# Patient Record
Sex: Female | Born: 1975 | Hispanic: Yes | Marital: Married | Smoking: Never smoker
Health system: Southern US, Community
[De-identification: ages and names within clinical notes are randomized; demographics above are authoritative.]

## PROBLEM LIST (undated history)

## (undated) DIAGNOSIS — E119 Type 2 diabetes mellitus without complications: Secondary | ICD-10-CM

## (undated) DIAGNOSIS — G43909 Migraine, unspecified, not intractable, without status migrainosus: Secondary | ICD-10-CM

---

## 2012-05-28 ENCOUNTER — Observation Stay: Payer: Self-pay | Admitting: Obstetrics and Gynecology

## 2012-05-28 LAB — URINALYSIS, COMPLETE
Ketone: NEGATIVE
Nitrite: NEGATIVE
Ph: 7 (ref 4.5–8.0)
Squamous Epithelial: 3
WBC UR: 1 /HPF (ref 0–5)

## 2012-06-20 ENCOUNTER — Emergency Department: Payer: Self-pay | Admitting: Emergency Medicine

## 2012-06-20 ENCOUNTER — Observation Stay: Payer: Self-pay | Admitting: Obstetrics and Gynecology

## 2012-06-22 ENCOUNTER — Inpatient Hospital Stay: Payer: Self-pay | Admitting: Obstetrics and Gynecology

## 2012-06-22 LAB — CBC WITH DIFFERENTIAL/PLATELET
Basophil %: 0.1 %
Eosinophil %: 0.7 %
HCT: 39.2 % (ref 35.0–47.0)
HGB: 12.9 g/dL (ref 12.0–16.0)
Lymphocyte %: 22.8 %
MCH: 27.9 pg (ref 26.0–34.0)
Monocyte %: 8.7 %
Neutrophil #: 7.5 10*3/uL — ABNORMAL HIGH (ref 1.4–6.5)
Neutrophil %: 67.7 %
RBC: 4.63 10*6/uL (ref 3.80–5.20)
WBC: 11 10*3/uL (ref 3.6–11.0)

## 2012-06-22 LAB — PROTEIN, URINE, 24 HOUR
Collection Hours: 24 hours
Protein, 24 Hour Urine: 504 mg/24HR — ABNORMAL HIGH (ref 30–149)
Protein, Urine: 28 mg/dL (ref 0–12)
Total Volume: 1800 mL

## 2012-06-23 LAB — HEMATOCRIT: HCT: 31.3 % — ABNORMAL LOW (ref 35.0–47.0)

## 2013-08-16 ENCOUNTER — Ambulatory Visit: Payer: Self-pay | Admitting: Internal Medicine

## 2013-08-16 IMAGING — CT CT ABD-PELV W/ CM
2 of 4 series · 16 of 46 positions shown, 18 images · IV contrast (isovue)
Comparison: None.

CLINICAL DATA: Abdominal pain and swelling.

EXAM:
CT ABDOMEN AND PELVIS WITH CONTRAST
TECHNIQUE: Multidetector CT imaging of the abdomen and pelvis was performed
using the standard protocol following bolus administration of
intravenous contrast.
CONTRAST:  125 mL Isovue 370 IV

[Series 2: routine abd pel with · axial · 0.75mm/px · z∈[-915,-435]mm · 13 of 106 slices shown, 15 images]
[im 5/106  soft-tissue]
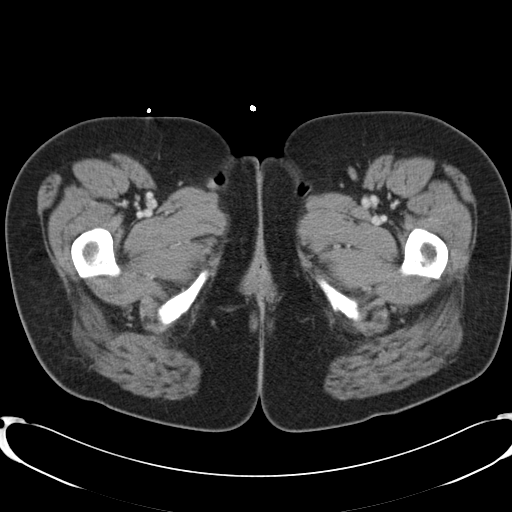
[im 5/106  bone]
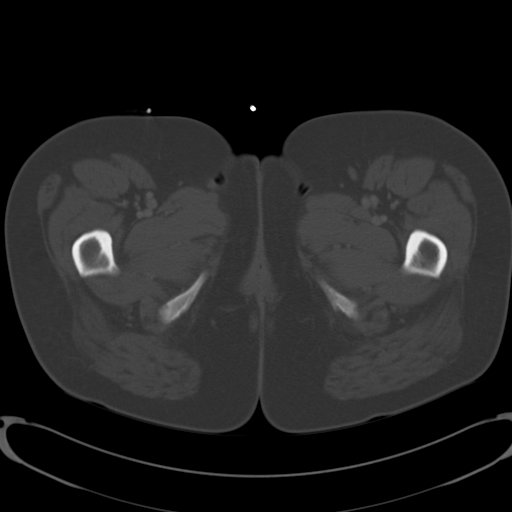
[im 13/106  soft-tissue]
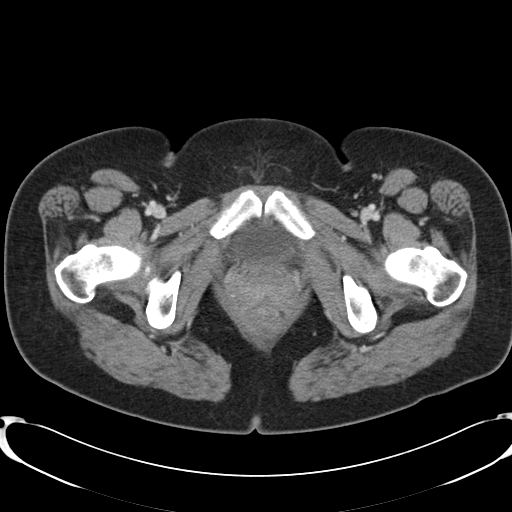
[im 22/106  soft-tissue]
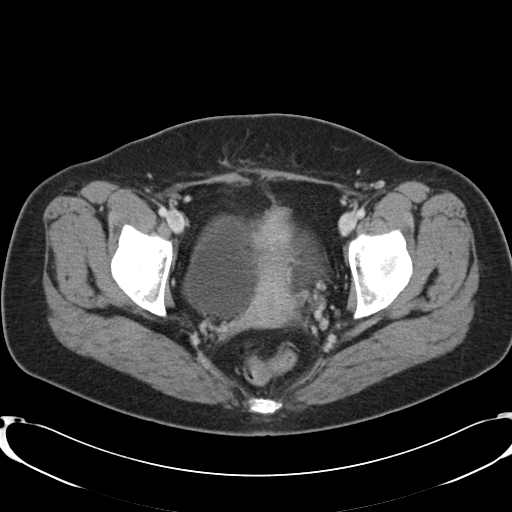
[im 30/106  soft-tissue]
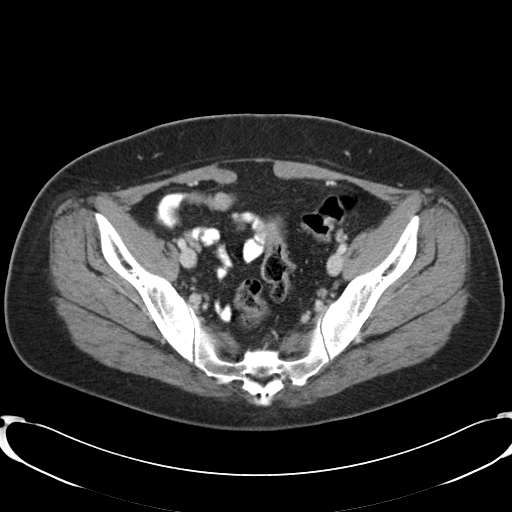
[im 38/106  soft-tissue]
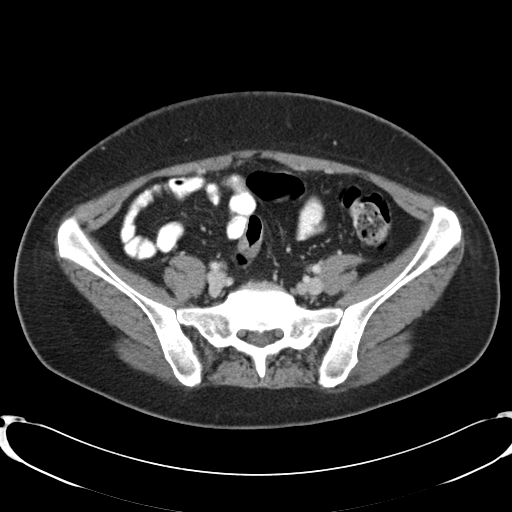
[im 47/106  soft-tissue]
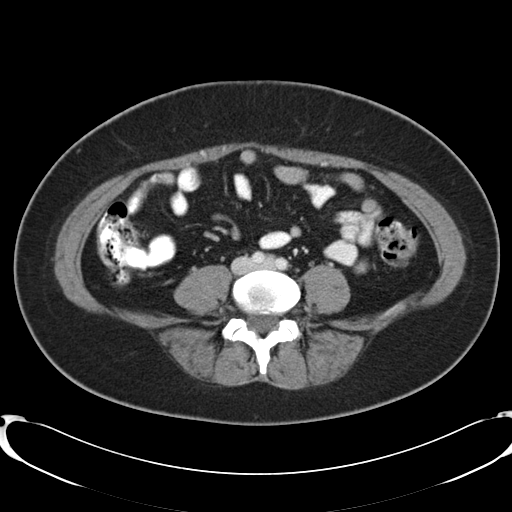
[im 55/106  soft-tissue]
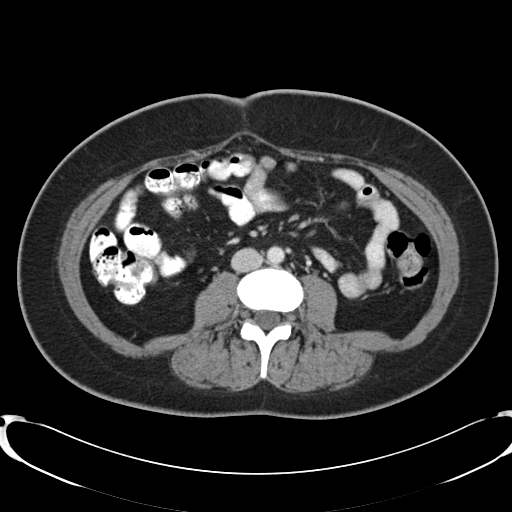
[im 59/106  soft-tissue]
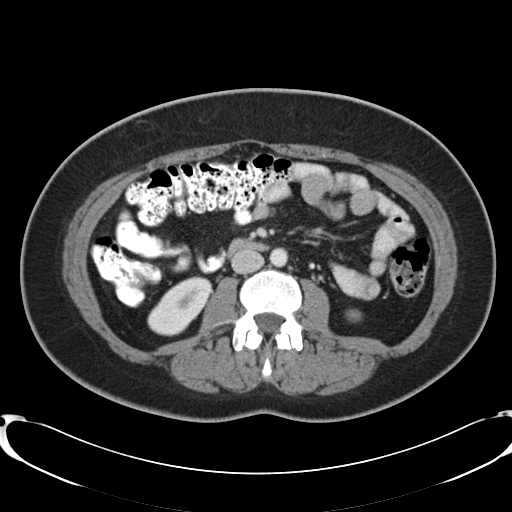
[im 68/106  soft-tissue]
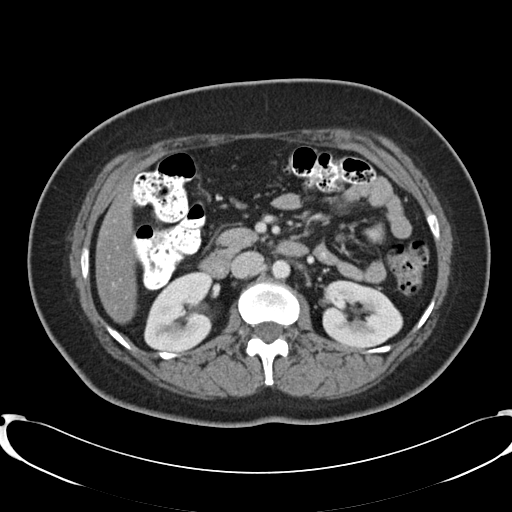
[im 68/106  bone]
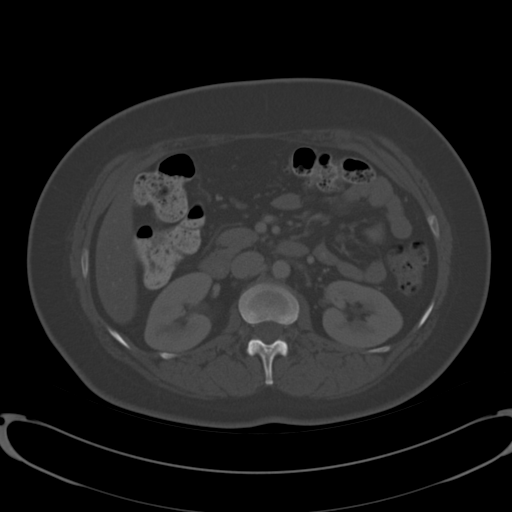
[im 76/106  soft-tissue]
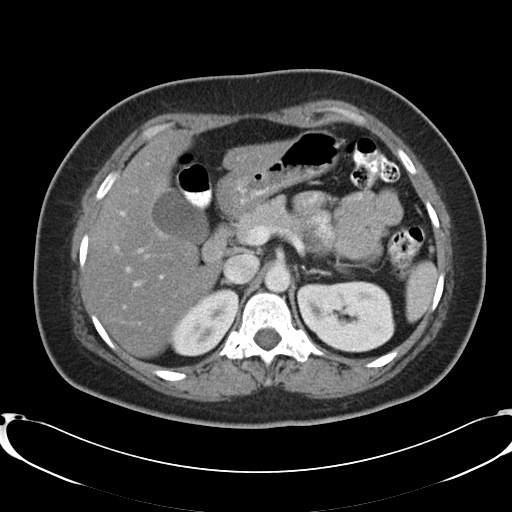
[im 85/106  soft-tissue]
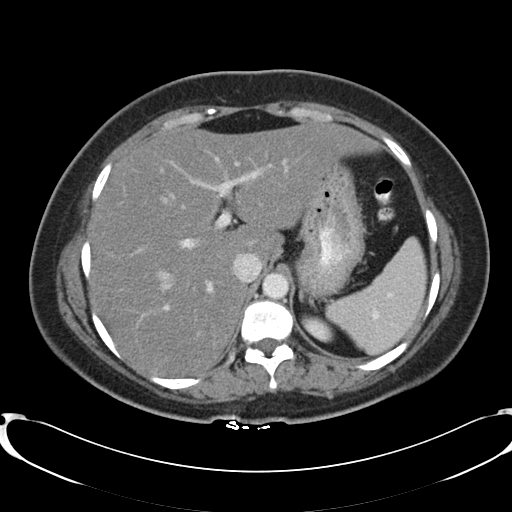
[im 93/106  soft-tissue]
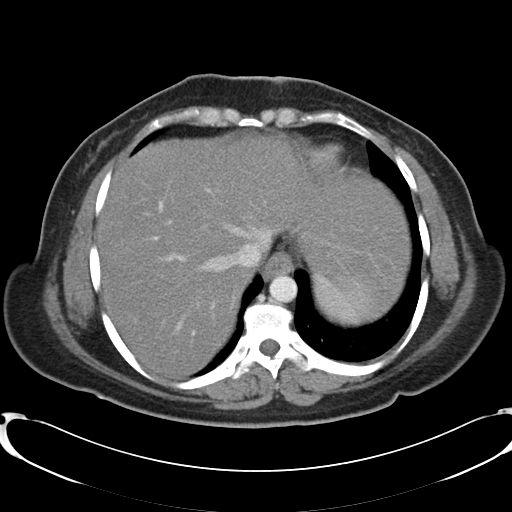
[im 101/106  soft-tissue]
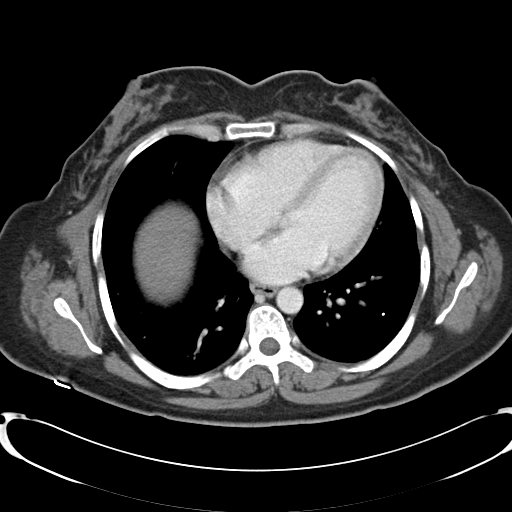

[Series 5: cor routine abd pel with · coronal · 0.79mm/px · 3 of 119 slices shown]
[im 40/119  soft-tissue]
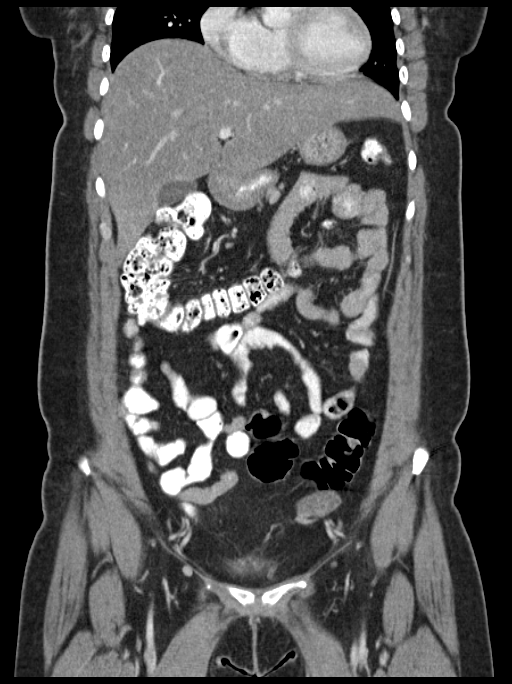
[im 53/119  soft-tissue]
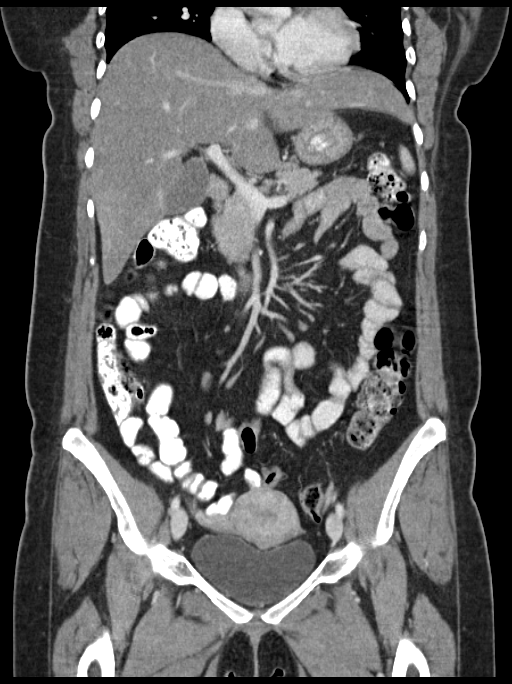
[im 66/119  soft-tissue]
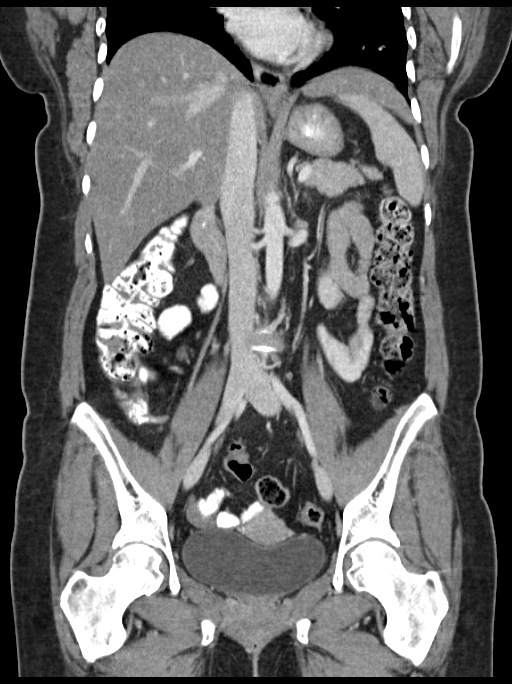

[16 of 46 positions shown; findings below may reference images not displayed]

FINDINGS: 3 mm calcified granuloma laterally in the visualized left lower
lobe. Fatty liver without focal lesion. Unremarkable gallbladder,
spleen, adrenal glands, kidneys, pancreas, abdominal aorta, portal
vein. Stomach, small bowel, and colon are nondilated. Normal
appendix. There may be a subtle eccentric area of mucosal thickening
in the proximal ascending colon, image 64/5. Single enlarged
regional ileocolic lymph node, 11 mm short axis diameter, with
low-attenuation centrally. No significant diverticular disease
identified. Uterus and adnexal regions unremarkable. Urinary bladder
physiologically distended. No ascites. No free air.
IMPRESSION: 1. Single necrotic ileocolic lymph node with questionable mucosal
lesion in proximal ascending colon. Recommend colonoscopy to exclude
mucosal lesion.
2. Fatty liver

## 2013-08-23 ENCOUNTER — Emergency Department: Payer: Self-pay | Admitting: Emergency Medicine

## 2013-08-23 LAB — COMPREHENSIVE METABOLIC PANEL
Albumin: 3.9 g/dL (ref 3.4–5.0)
Anion Gap: 7 (ref 7–16)
BUN: 9 mg/dL (ref 7–18)
Chloride: 107 mmol/L (ref 98–107)
Glucose: 105 mg/dL — ABNORMAL HIGH (ref 65–99)
Osmolality: 275 (ref 275–301)
SGPT (ALT): 81 U/L — ABNORMAL HIGH (ref 12–78)
Sodium: 138 mmol/L (ref 136–145)
Total Protein: 8 g/dL (ref 6.4–8.2)

## 2013-08-23 LAB — URINALYSIS, COMPLETE
Glucose,UR: NEGATIVE mg/dL (ref 0–75)
Leukocyte Esterase: NEGATIVE
Nitrite: NEGATIVE
Ph: 7 (ref 4.5–8.0)
RBC,UR: 3 /HPF (ref 0–5)
Squamous Epithelial: 2
WBC UR: 1 /HPF (ref 0–5)

## 2013-08-23 LAB — CBC WITH DIFFERENTIAL/PLATELET
Basophil #: 0.1 10*3/uL (ref 0.0–0.1)
Eosinophil #: 0.2 10*3/uL (ref 0.0–0.7)
Eosinophil %: 2.3 %
HGB: 13.8 g/dL (ref 12.0–16.0)
Lymphocyte #: 2.9 10*3/uL (ref 1.0–3.6)
Lymphocyte %: 28.9 %
MCH: 28.7 pg (ref 26.0–34.0)
MCV: 85 fL (ref 80–100)
Monocyte #: 0.7 x10 3/mm (ref 0.2–0.9)
Neutrophil %: 61.2 %

## 2013-08-23 LAB — LIPASE, BLOOD: Lipase: 111 U/L (ref 73–393)

## 2013-10-07 ENCOUNTER — Ambulatory Visit: Payer: Self-pay | Admitting: Gastroenterology

## 2013-10-11 LAB — PATHOLOGY REPORT

## 2014-10-11 ENCOUNTER — Emergency Department: Payer: Self-pay | Admitting: Emergency Medicine
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
## 2014-12-09 ENCOUNTER — Encounter: Admit: 2014-12-09 | Disposition: A | Discharge status: Home / Self Care | Payer: Self-pay

## 2014-12-13 NOTE — Op Note (Signed)
PATIENT NAME:  Ashlee Rios, Ashlee Rios MR#:  [id number] DATE OF BIRTH:  [birth year]  DATE OF PROCEDURE:  06/22/2012  PREOPERATIVE DIAGNOSES:  1. Mild preeclampsia.  2. Labile blood pressure.  3. Breech presentation. 4. Elective permanent sterilization.   POSTOPERATIVE DIAGNOSES:  1. Mild preeclampsia.  2. Labile blood pressure.  3. Breech presentation. 4. Elective permanent sterilization.    PROCEDURES:  1. Primary low transverse Cesarean section. 2. Breech extraction. 3. Pomeroy bilateral tubal ligation. 4. On-Q pump placement.   SURGEON: Suzy Bouchardhomas J. Aislee Landgren, MD   FIRST ASSISTANVilma Meckel: Doman, scrub tech    ANESTHESIA: Spinal.   INDICATIONS: This is a 39 year old gravida 2 para 1 patient at 3336 + 6 weeks estimated gestational age who presented to Labor and Delivery for re-evaluation of elevated blood pressure. The patient just completed a 24 hour urine which showed 500 mg of proteinuria. The patient was complaining of headache for two days. Infant was documented to be in the breech presentation based on today's ultrasound. After consideration for trial of external cephalic version versus Cesarean section and tubal ligation, the patient opted for the latter.    PROCEDURE: After adequate spinal anesthesia, the patient was placed in the dorsal supine position with a hip roll under the right side. The patient's abdomen was prepped and draped in a normal sterile fashion. Pfannenstiel incision was made two fingerbreadths above the symphysis pubis. Sharp dissection was used to identify the fascia. Fascia was opened in the midline and opened in a transverse fashion. The superior aspect of the fascia was grasped with Kocher clamps and the recti muscles dissected free. Inferior aspect of the fascia was grasped with Kocher clamps and pyramidalis muscle was dissected free. The peritoneal cavity was accomplished sharply. Vesicouterine peritoneal fold was identified and bladder flap was created and the  bladder was reflected inferiorly. A low transverse uterine incision was made. Upon entry into the endometrial cavity, clear fluid resulted. Incision was extended with blunt transverse traction. Fetal feet were then delivered through the incision followed by buttocks and arms with a sweeping medial motion and the head was delivered by Mauriceau-Smellie-Veit maneuver without difficulty. A vigorous female was passed to Dr. Beckie Saltsasnadi who assigned Apgar scores of 9 and 9. Placenta was manually delivered and the uterus was exteriorized. The endometrial cavity was wiped clean with laparotomy tape and the cervix was opened with a ring forcep and the ring forcep was passed off the operative field. The uterine incision was closed with a running 1 chromic suture. Two additional figure-of-eight sutures were used for good hemostasis. Fallopian tubes and ovaries appeared normal.   Attention was directed to the patient's right fallopian tube which was grasped at the midportion of the fallopian tubes. Two separate 0 plain gut sutures were applied and a 1.5 cm portion of fallopian tube removed. A similar procedure was repeated on the patient's left fallopian tube after grasping at the midportion of the fallopian tube. Again, two separate 0 plain gut sutures were applied and a 1.5 cm portion of the fallopian tube removed. Posterior cul-de-sac was irrigated and suctioned. Uterus was then placed back into the abdominal cavity and the paracolic gutters were wiped clean with laparotomy tape. The tubal ligation sites appeared hemostatic. The uterine incision again appeared hemostatic.   Interceed was placed over the uterine incision in a T-shaped fashion. Superior fascia was grasped with Kocher clamps and On-Q pump catheters were advanced subfascially. The fascia was then closed above this with one Vicryl suture in a running nonlocking  fashion with good approximation of edges. Subcutaneous tissues were irrigated and bovied for hemostasis.  Given the subcutaneous in depth of approximately 3 cm, the subcutaneous tissues were closed with interrupted 2-0 chromic suture. Skin was reapproximated with Insorb absorbable staples. The On-Q pump catheters were secured at the skin level and then loaded with 5 mL of 0.5% each catheter. There were no complications. The patient tolerated the procedure well.     Estimated blood 700 mL. Intraoperative fluids 1500 mL. The patient was taken to the recovery room in good condition.   ____________________________ Suzy Bouchard, MD tjs:drc D: 06/22/2012 18:01:37 ET T: 06/23/2012 09:01:59 ET JOB#: [id number]  cc: Suzy Bouchard, MD, <Dictator> Suzy Bouchard MD ELECTRONICALLY SIGNED 06/29/2012 8:50

## 2014-12-13 NOTE — Discharge Summary (Signed)
PATIENT NAME:  Ashlee Rios, Diera MR#:  [id number] DATE OF BIRTH:  12-Nov-1975  DATE OF ADMISSION:  06/22/2012 DATE OF DISCHARGE:  06/25/2012  PRINCIPLE PROCEDURE: Primary low transverse cesarean section.   HOSPITAL COURSE: The patient underwent the above procedure. The patient had mild preeclampsia and breech presentation at 36 plus 6 weeks. Cesarean section uncomplicated. Postoperative day number one hematocrit 31.3%. The patient did well postoperatively. On the day of discharge, blood pressure 128/83. Discharged to home in good condition. The patient will follow-up in two weeks for wound care. Discharge medications are Norco and Motrin. The patient was given precautions to return to clinic before that time for nausea, vomiting, wound drainage, or fever.  ____________________________ Suzy Bouchardhomas J. Schermerhorn, MD tjs:slb D: 07/02/2012 09:01:08 ET T: 07/02/2012 14:23:56 ET JOB#: [id number]  cc: Suzy Bouchardhomas J. Schermerhorn, MD, <Dictator> Suzy BouchardHOMAS J SCHERMERHORN MD ELECTRONICALLY SIGNED 07/05/2012 10:53

## 2014-12-27 ENCOUNTER — Ambulatory Visit: Payer: BLUE CROSS/BLUE SHIELD | Attending: Orthopedic Surgery | Admitting: Occupational Therapy

## 2014-12-27 DIAGNOSIS — M79641 Pain in right hand: Secondary | ICD-10-CM | POA: Insufficient documentation

## 2014-12-27 DIAGNOSIS — M654 Radial styloid tenosynovitis [de Quervain]: Secondary | ICD-10-CM | POA: Diagnosis not present

## 2014-12-27 DIAGNOSIS — M25641 Stiffness of right hand, not elsewhere classified: Secondary | ICD-10-CM | POA: Diagnosis not present

## 2014-12-27 DIAGNOSIS — M25531 Pain in right wrist: Secondary | ICD-10-CM | POA: Diagnosis not present

## 2014-12-27 DIAGNOSIS — M79631 Pain in right forearm: Secondary | ICD-10-CM

## 2014-12-27 DIAGNOSIS — M6281 Muscle weakness (generalized): Secondary | ICD-10-CM | POA: Diagnosis not present

## 2014-12-27 DIAGNOSIS — M25631 Stiffness of right wrist, not elsewhere classified: Secondary | ICD-10-CM | POA: Diagnosis not present

## 2014-12-27 NOTE — Patient Instructions (Signed)
Reinforce use of hand with wrist straight during lateral grip - pinching  Use splint - pay attention the later in day at work to keep wrist straight and pick up/carry with forearm and palm  Thumb spica prefab out side of work and at work wrist neoprene splint/ wrap

## 2014-12-27 NOTE — Therapy (Signed)
[facility] [address] [place], [address] Phone: [phone]   Fax:  [phone]  Occupational Therapy Treatment  Patient Details  Name: Ashlee Rios MRN: [id number] Date of Birth: Nov 20, 1975 Referring Provider:  Evon Slack, PA-C  Encounter Date: 12/27/2014      OT End of Session - 12/27/14 0908    Visit Number 6   Number of Visits 6   Date for OT Re-Evaluation 01/20/15   Authorization Type [employer] options   OT Start Time 0808   OT Stop Time 0852   OT Time Calculation (min) 44 min   Activity Tolerance Patient tolerated treatment well      No past medical history on file.  No past surgical history on file.  There were no vitals filed for this visit.  Visit Diagnosis:  Pain of right forearm  Wrist stiffness, right  Muscle weakness      Subjective Assessment - 12/27/14 0857    Subjective  Pain on the bottom of wrist  now since about Saturday  - I did the same stuff - nothing difference   Patient is accompained by: Interpreter                      OT Treatments/Exercises (OP) - 12/27/14 0001    Modalities   Modalities Iontophoresis   Iontophoresis   Type of Iontophoresis Dexamethasone   Location 1st dorsal compartment   Dose small pathc   Time 19 min   Splinting   Splinting prefab thumb spica out side of work , neoprene wrap at work        Reinforce use of hand with wrist straight during lateral grip - pinching  Review with pt modifications for work and use at home  Use splint - pay attention the later in day at work to keep wrist straight and pick up/carry with forearm and palm             OT Short Term Goals - 12/27/14 0918    OT SHORT TERM GOAL #1   Title Pt. will present with increased thumb PA and RA AROM to [facility] to be able to use thumb in cooking for 10 min without increase of symptoms in    Baseline Still increase pain with use     Time 4   Period Weeks   Status On-going   OT SHORT TERM GOAL #2   Title Pt. will have increased AROM at R wrist to [facility] to do house hold activities    Baseline AROM still impaired    Status On-going   OT SHORT TERM GOAL #3   Title Pt. to be indep. with HEP for wearing thumb spica, weaning from it within a few weeks and upgrading HEP with ROM and strengthening    Baseline Wearing splints 24 hrs    Time 4   Period Weeks   Status On-going           OT Long Term Goals - 12/27/14 1027    OT LONG TERM GOAL #1   Title Pt. will have improvement with pain by at least 15 points and function by 15 points on PRWHE in R dominant hand    Baseline Pain still 3/10 at 1 st dorsal compartment; 4/10 pain palmar wrist    Time 4   Period Weeks   Status On-going   OT LONG TERM GOAL #2   Title Grip strength  increase to 50% compare to L to be able to do work without increase symptoms to end of day    Time 4   Period Weeks   Status New               Plan - 12/27/14 0912    Clinical Impression Statement [facility] cont to have pain 3/10 over 1st dorsal compartment and 4/10 pain this date over palmar wrist - cont ionto - did adress this date again moditying  activities as well  as use of splints -    Pt will benefit from skilled therapeutic intervention in order to improve on the following deficits (Retired) Pain;Decreased strength;Impaired flexibility   Rehab Potential Good   OT Frequency 2x / week   OT Duration 4 weeks   OT Treatment/Interventions Iontophoresis;Contrast Bath;Splinting;Therapeutic exercises   Plan Pt to cont with ionto and pt has appt with MD on Friday  - cont to decrease pain - assess pain level    OT Home Exercise Plan Cont with splints 24 hrs         Problem List There are no active problems to display for this patient.   Maritssa Haughton OTR/L, CLT 12/27/2014, 9:30 AM  [facility]  [facility] [address] [place], [place], [address] Phone: [phone]   Fax:  [phone]

## 2014-12-29 ENCOUNTER — Ambulatory Visit: Payer: BLUE CROSS/BLUE SHIELD | Admitting: Occupational Therapy

## 2014-12-29 ENCOUNTER — Encounter: Payer: Self-pay | Admitting: Occupational Therapy

## 2014-12-29 DIAGNOSIS — M654 Radial styloid tenosynovitis [de Quervain]: Secondary | ICD-10-CM | POA: Diagnosis not present

## 2014-12-29 DIAGNOSIS — M6281 Muscle weakness (generalized): Secondary | ICD-10-CM

## 2014-12-29 DIAGNOSIS — M2560 Stiffness of unspecified joint, not elsewhere classified: Secondary | ICD-10-CM

## 2014-12-29 DIAGNOSIS — M79641 Pain in right hand: Secondary | ICD-10-CM

## 2014-12-29 DIAGNOSIS — IMO0002 Reserved for concepts with insufficient information to code with codable children: Secondary | ICD-10-CM

## 2014-12-29 NOTE — Therapy (Signed)
[facility] [address] [place], [place], [address] Phone: [phone]   Fax:  [phone]  Occupational Therapy Treatment  Patient Details  Name: Ashlee Rios MRN: [id number] Date of Birth: March 19, 1976 Referring Provider:  Evon SlackGaines, Thomas C, PA-C  Encounter Date: 12/29/2014      OT End of Session - 12/29/14 1956    Visit Number 7   Number of Visits 12   Date for OT Re-Evaluation 01/20/15   Authorization Type [employer] options   OT Start Time 1630   OT Stop Time 1700   OT Time Calculation (min) 30 min      History reviewed. No pertinent past medical history.  History reviewed. No pertinent past surgical history.  There were no vitals filed for this visit.  Visit Diagnosis:  Pain of right hand  Stiffness of extremity  Muscle weakness      Subjective Assessment - 12/29/14 1946    Subjective  My pain is much better - some still at the side of my wrist - about 2-3/10 - and still wearing my splints    Patient is accompained by: Family member   Patient Stated Goals to get the pain better that I can use my hand and not wear any splints    Currently in Pain? Yes   Pain Score 3    Pain Location Wrist   Pain Orientation Right   Pain Descriptors / Indicators Aching   Pain Onset 1 to 4 weeks ago   Aggravating Factors  Using my hand    Pain Relieving Factors Splint and not using my hand            [facility] OT Assessment - 12/29/14 0001    AROM   AROM Assessment Site Wrist   Right Wrist Extension 65 Degrees   Right Wrist Flexion 85 Degrees   Right Wrist Radial Deviation 18 Degrees   Right Wrist Ulnar Deviation 40 Degrees                  OT Treatments/Exercises (OP) - 12/29/14 0001    Modalities   Modalities Iontophoresis   Iontophoresis   Type of Iontophoresis Dexamethasone   Location 1st dorsal compartment   Dose small pathc   Time 19 min   Splinting   Splinting prefab  thumb spica out side of work , neoprene wrap at work                 OT Education - 12/29/14 1955    Education provided Yes   Starwood HotelsPerson(s) Educated Patient;Child(ren)   Methods Explanation;Demonstration   Comprehension Verbalized understanding          OT Short Term Goals - 12/29/14 2000    OT SHORT TERM GOAL #1   Title Pt. will present with increased thumb PA and RA [facility]OM [facility] to be able to use thumb in cooking for 10 min without increase of symptoms in    Baseline Still increase pain with use    Time 4   Period Weeks   Status On-going   OT SHORT TERM GOAL #2   Title Pt. will have increased [facility] do [facility] activities    Baseline still limited by pain and wearing splint    Time 4   Period Weeks   Status On-going   OT SHORT TERM GOAL #3   Title Pt. to be indep. with HEP for wearing  thumb spica, weaning from it within a few weeks and upgrading HEP with ROM and strengthening    Baseline Wearing splints 24 hrs    Time 4   Period Weeks   Status On-going           OT Long Term Goals - 12/29/14 2001    OT LONG TERM GOAL #1   Title Pt. will have improvement with pain by at least 15 points and function by 15 points on PRWHE in R dominant hand    Baseline Pain still 3/10 at 1 st dorsal compartment; 4/10 pain palmar wrist    Time 4   Period Weeks   Status On-going   OT LONG TERM GOAL #2   Title Grip strength increase to 50% compare to L to be able to do work without increase symptoms to end of day    Time 4   Status On-going               Plan - 12/29/14 1957    Clinical Impression Statement Pt cont to show [facility]  [facility] - decrease pain with ROM and increase motion - pt to cont wth splint wearing and recommend 4 more sessions of Ionto and then start weaning out of splnt and strenghtening    Pt will benefit from skilled therapeutic intervention in order to improve on the following deficits (Retired)  Pain;Decreased strength;Impaired flexibility   Rehab Potential Good   OT Frequency 2x / week   OT Duration 4 weeks   OT Treatment/Interventions Iontophoresis;Contrast Bath;Splinting;Therapeutic exercises   Plan Pt to see MD tomorrow - recommend cont ionto and then wean out of splint and strengthening    OT Home Exercise Plan Cont with splints 24 hrs    Consulted and Agree with Plan of Care Patient        Problem List There are no active problems to display for this patient.   [name] OTR/L, CLT 12/29/2014, 8:05 PM  [facility] [address] [place], [place], [address] Phone: [phone]   Fax:  [phone]

## 2014-12-29 NOTE — Patient Instructions (Signed)
Cont with thumb spica for outside of work and soft neoprene splint for work   Ice as needed   And using palm and forearm to pick up objects - not hand or thumb

## 2015-01-03 ENCOUNTER — Encounter: Payer: BLUE CROSS/BLUE SHIELD | Admitting: Occupational Therapy

## 2015-01-03 ENCOUNTER — Ambulatory Visit: Payer: BLUE CROSS/BLUE SHIELD | Admitting: Occupational Therapy

## 2015-01-03 DIAGNOSIS — M79631 Pain in right forearm: Secondary | ICD-10-CM

## 2015-01-03 DIAGNOSIS — M654 Radial styloid tenosynovitis [de Quervain]: Secondary | ICD-10-CM | POA: Diagnosis not present

## 2015-01-03 DIAGNOSIS — M6281 Muscle weakness (generalized): Secondary | ICD-10-CM

## 2015-01-03 DIAGNOSIS — M79641 Pain in right hand: Secondary | ICD-10-CM

## 2015-01-03 DIAGNOSIS — M2560 Stiffness of unspecified joint, not elsewhere classified: Secondary | ICD-10-CM

## 2015-01-03 DIAGNOSIS — IMO0002 Reserved for concepts with insufficient information to code with codable children: Secondary | ICD-10-CM

## 2015-01-03 DIAGNOSIS — M25631 Stiffness of right wrist, not elsewhere classified: Secondary | ICD-10-CM

## 2015-01-03 NOTE — H&P (Signed)
L&D Evaluation:  History:   HPI 39 y/o HF G2P1001 at 3636w4d    Presents with abdominal pain, Left-sided/low    Patient's Medical History No Chronic Illness  recently treated for yeast vaginitis    Patient's Surgical History none    Medications Pre Natal Vitamins    Allergies NKDA    Social History none    Family History Non-Contributory   ROS:   ROS All systems were reviewed.  HEENT, CNS, GI, GU, Respiratory, CV, Renal and Musculoskeletal systems were found to be normal.   Exam:   Vital Signs stable    General no apparent distress    Mental Status clear    Heart normal sinus rhythm    Abdomen gravid, non-tender    Estimated Fetal Weight Average for gestational age    Back no CVAT    Reflexes 1+    Pelvic cervix closed and thick    Mebranes Intact    FHT normal rate with no decels    Ucx absent    Other clumpy d/c on glove   Impression:   Impression Abdominal pain at 33 weeks   Plan:   Plan monitor contractions and for cervical change    Comments Unclear if pt has taken meds for yeast correctly Left ovarian cyst was noted at 20 week U/S (2.5cm) -- if pain persists, recommend repeat U/S Interpreter present thruout   Electronic Signatures: Margaretha GlassingEvans, Ricky L (MD)  (Signed 03-Oct-13 17:34)  Authored: L&D Evaluation   Last Updated: 03-Oct-13 17:34 by Margaretha GlassingEvans, Ricky L (MD)

## 2015-01-03 NOTE — Patient Instructions (Signed)
Pt instructed to go back in thumb spica out side of work - do contrast or ice for pain - and this OT phoned MD to report pt was put back in splint Wear soft neoprene at work

## 2015-01-03 NOTE — Therapy (Signed)
[place] [facility] [address] [place], [place], [address] Phone: [phone]   Fax:  [phone]  Occupational Therapy Treatment  Patient Details  Name: Ashlee Rios MRN: [id number] Date of Birth: February 10, 1976 Referring Provider:  Evon SlackGaines, Thomas C, PA-C  Encounter Date: 01/03/2015      OT End of Session - 01/03/15 0930    Visit Number 8   Number of Visits 12   Date for OT Re-Evaluation 01/20/15   Authorization Type [employer] options   OT Start Time 0812   OT Stop Time 0910   OT Time Calculation (min) 58 min   Activity Tolerance Patient limited by pain      No past medical history on file.  No past surgical history on file.  There were no vitals filed for this visit.  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 Yes   Pain Score 5    Pain Location Wrist   Pain Descriptors / Indicators Burning;Throbbing;Tender   Pain Type Acute pain   Pain Onset In the past 7 days   Pain Frequency Constant   Aggravating Factors  using my hand , not wearing my splint    Pain Relieving Factors splint                      OT Treatments/Exercises (OP) - 01/03/15 0001    Exercises   Exercises Hand   Hand Exercises   MCPJ Flexion AROM;Other (comment)   Other Hand Exercises Pain 5/10 pain with ulnar Devaiiation with thumb static, also with thumb ABD and ADD   Iontophoresis   Type of Iontophoresis  Dexamethasone   Location 1st dorsal compartment   Dose small pathc   Time 24   Splinting   Splinting prefab thumb spica out side of work , neoprene wrap at work                 OT Education - 01/03/15 0929    Education provided Yes   Education Details SPlint wearing    Person(s) Educated Patient;Spouse   Methods Explanation;Verbal cues   Comprehension Verbalized understanding          OT Short Term Goals - 12/29/14 2000    OT SHORT TERM GOAL #1   Title Pt. will present with increased thumb PA and RA AROM to [facility] to be able to use thumb in cooking for 10 min without increase of symptoms in    Baseline Still increase pain with use    Time 4   Period Weeks   Status On-going   OT SHORT TERM GOAL #2   Title Pt. will have increased AROM at R wrist to [facility] to do house hold activities    Baseline still limited by pain and wearing splint  Time 4   Period Weeks   Status On-going   OT SHORT TERM GOAL #3   Title Pt. to be indep. with HEP for wearing thumb spica, weaning from it within a few weeks and upgrading HEP with ROM and strengthening    Baseline Wearing splints 24 hrs    Time 4   Period Weeks   Status On-going           OT Long Term Goals - 12/29/14 2001    OT LONG TERM GOAL #1   Title Pt. will have improvement with pain by at least 15 points and function by 15 points on PRWHE in R dominant hand    Baseline Pain still 3/10 at 1 st dorsal compartment; 4/10 pain palmar wrist    Time 4   Period Weeks   Status On-going   OT LONG TERM GOAL #2   Title Grip strength increase to 50% compare to L to be able to do work without increase symptoms to end of day    Time 4   Status On-going               Plan - 01/03/15 0931    Clinical Impression Statement Pt report increase pain this date - 5/10 since shot on Monday - also did not wear thumb spica since then per MD order - pt this date did not tolerate ionto - did only 20 min but on 0.5 current - and  instructed pt to wear outside of work thujmb spica again and MD office was phoned - will reassess pain on Thursday - pain with AROM of wrist nad thumb in all planes - pain 5/10 at rest    Pt will benefit from skilled therapeutic intervention in order to improve on the following deficits (Retired) Pain;Decreased strength;Impaired flexibility   Rehab Potential Good   OT Frequency 2x / week   OT Duration 4 weeks   OT Treatment/Interventions Iontophoresis;Contrast Bath;Splinting;Therapeutic exercises   Plan Assess pain and splint wearing - notify MD again    OT Home Exercise Plan Cont with splints 24 hrs    Consulted and Agree with Plan of Care Patient;Family member/caregiver        Problem List There are no active problems to display for this patient.   Oletta CohnuPreez, Eevie Lapp OTR/L ,CLT 01/03/2015, 9:38 AM  [place] [facility] [address] [place], [place], [address] Phone: [phone]   Fax:  [phone]

## 2015-01-04 ENCOUNTER — Encounter: Payer: BLUE CROSS/BLUE SHIELD | Admitting: Occupational Therapy

## 2015-01-05 ENCOUNTER — Encounter: Payer: BLUE CROSS/BLUE SHIELD | Admitting: Occupational Therapy

## 2015-01-05 ENCOUNTER — Ambulatory Visit: Payer: BLUE CROSS/BLUE SHIELD | Admitting: Occupational Therapy

## 2015-01-05 DIAGNOSIS — M79631 Pain in right forearm: Secondary | ICD-10-CM

## 2015-01-05 DIAGNOSIS — M79641 Pain in right hand: Secondary | ICD-10-CM

## 2015-01-05 DIAGNOSIS — M25631 Stiffness of right wrist, not elsewhere classified: Secondary | ICD-10-CM

## 2015-01-05 DIAGNOSIS — M654 Radial styloid tenosynovitis [de Quervain]: Secondary | ICD-10-CM | POA: Diagnosis not present

## 2015-01-05 DIAGNOSIS — M2560 Stiffness of unspecified joint, not elsewhere classified: Secondary | ICD-10-CM

## 2015-01-05 DIAGNOSIS — IMO0002 Reserved for concepts with insufficient information to code with codable children: Secondary | ICD-10-CM

## 2015-01-05 DIAGNOSIS — M6281 Muscle weakness (generalized): Secondary | ICD-10-CM

## 2015-01-05 NOTE — Therapy (Signed)
[place] [facility] [address] [place], [place], [address] Phone: [phone]   Fax:  [phone]  Occupational Therapy Treatment  Patient Details  Name: Ashlee AuMirna D Guardado Rios MRN: [id number] Date of Birth: 1975/11/01 Referring Provider:  Evon SlackGaines, Thomas C, PA-C  Encounter Date: 01/05/2015      OT End of Session - 01/05/15 1820    Visit Number 9   Number of Visits 12   Date for OT Re-Evaluation 01/20/15   Authorization Type [employer] options   OT Start Time 1630   OT Stop Time 1713   OT Time Calculation (min) 43 min   Activity Tolerance Patient limited by pain   Behavior During Therapy [facility] for tasks assessed/performed      No past medical history on file.  No past surgical history on file.  There were no vitals filed for this visit.  Visit Diagnosis:  Pain of right hand  Stiffness of extremity  Pain of right forearm  Muscle weakness  Wrist stiffness, right      Subjective Assessment - 01/05/15 1814    Subjective  Still pain but down to about 4/10 - heat let it feel better than ice - and I am wearing my splint with metal bar outside of work and soft at work - I cannot come early in AM only late on Thursday - my work giving me hard time    Patient is accompained by: Family member   Patient Stated Goals I want the pain better again    Currently in Pain? Yes   Pain Score 4    Pain Location Hand   Pain Orientation Right   Pain Descriptors / Indicators Aching;Sharp;Tender   Pain Onset In the past 7 days   Pain Frequency Constant   Pain Relieving Factors splint and heat                      OT Treatments/Exercises (OP) - 01/05/15 0001    Hand Exercises   Other Hand Exercises Pain 4/10 wit UD of wrist with thumb static, PA  less pain , RA pain with ADD   Modalities   Modalities Iontophoresis   Iontophoresis   Type of Iontophoresis Dexamethasone   Location 1st dorsal compartment   Dose med patch    Time 27   Splinting   Splinting prefab thumb spica out side of work , neoprene wrap at work                 OT Education - 01/05/15 1819    Education provided Yes   Education Details splint wearing    Person(s) Educated Patient   Methods Explanation;Verbal cues   Comprehension Verbalized understanding;Returned demonstration          OT Short Term Goals - 12/29/14 2000    OT SHORT TERM GOAL #1   Title Pt. will present with increased thumb PA and RA AROM to [facility] to be able to use thumb in cooking for 10 min without increase of symptoms in    Baseline Still increase pain with use    Time 4   Period Weeks   Status On-going   OT SHORT TERM GOAL #2   Title Pt. will have increased AROM at R wrist to [facility] to do house hold activities    Baseline still limited by pain and wearing splint    Time 4   Period Weeks   Status On-going  OT SHORT TERM GOAL #3   Title Pt. to be indep. with HEP for wearing thumb spica, weaning from it within a few weeks and upgrading HEP with ROM and strengthening    Baseline Wearing splints 24 hrs    Time 4   Period Weeks   Status On-going           OT Long Term Goals - 12/29/14 2001    OT LONG TERM GOAL #1   Title Pt. will have improvement with pain by at least 15 points and function by 15 points on PRWHE in R dominant hand    Baseline Pain still 3/10 at 1 st dorsal compartment; 4/10 pain palmar wrist    Time 4   Period Weeks   Status On-going   OT LONG TERM GOAL #2   Title Grip strength increase to 50% compare to L to be able to do work without increase symptoms to end of day    Time 4   Status On-going               Plan - 01/05/15 1821    Clinical Impression Statement Pt decreased since Monday from 5 to 4/10 - splint wearing 24hrs - was able to tolerate ionto this date and medium patch come in so use that instead of small  - pt still had pain with AROM assessment - per pt request because of work decrease to 1  x wk    Pt will benefit from skilled therapeutic intervention in order to improve on the following deficits (Retired) Pain;Decreased strength;Impaired flexibility   Rehab Potential Good   OT Frequency 1x / week   OT Duration 2 weeks   OT Treatment/Interventions Iontophoresis;Contrast Bath;Splinting;Therapeutic exercises   OT Home Exercise Plan Cont with splints 24 hrs    Consulted and Agree with Plan of Care Patient        Problem List There are no active problems to display for this patient.   Jaqwan Wieber OTR/L ;CLT 01/05/2015, 6:24 PM  [place] [facility] [address] [place], [place], [address] Phone: [phone]   Fax:  [phone]

## 2015-01-05 NOTE — Patient Instructions (Signed)
Heat or ice what ever help for pain  Thumb spica outside of work and neoprene at work

## 2015-01-12 ENCOUNTER — Ambulatory Visit: Payer: BLUE CROSS/BLUE SHIELD | Admitting: Occupational Therapy

## 2015-01-12 DIAGNOSIS — M25631 Stiffness of right wrist, not elsewhere classified: Secondary | ICD-10-CM

## 2015-01-12 DIAGNOSIS — M2560 Stiffness of unspecified joint, not elsewhere classified: Secondary | ICD-10-CM

## 2015-01-12 DIAGNOSIS — M79631 Pain in right forearm: Secondary | ICD-10-CM

## 2015-01-12 DIAGNOSIS — M6281 Muscle weakness (generalized): Secondary | ICD-10-CM

## 2015-01-12 DIAGNOSIS — M79641 Pain in right hand: Secondary | ICD-10-CM

## 2015-01-12 DIAGNOSIS — M654 Radial styloid tenosynovitis [de Quervain]: Secondary | ICD-10-CM | POA: Diagnosis not present

## 2015-01-12 DIAGNOSIS — IMO0002 Reserved for concepts with insufficient information to code with codable children: Secondary | ICD-10-CM

## 2015-01-12 NOTE — Patient Instructions (Signed)
Can do still heat and cold - and using splints at all times - no AROM or use outside of splints

## 2015-01-12 NOTE — Therapy (Signed)
[facility] [address] [place], [place], [address] Phone: [phone]   Fax:  [phone]  Occupational Therapy Treatment  Patient Details  Name: Ashlee AuMirna D Guardado Carabantes MRN: [id number] Date of Birth: August 11, 1976 Referring Provider:  Evon SlackGaines, Thomas C, PA-C  Encounter Date: 01/12/2015      OT End of Session - 01/12/15 1730    Visit Number 10   Number of Visits 12   Date for OT Re-Evaluation 01/20/15   Authorization Type [employer] options   OT Start Time 1633   OT Stop Time 1710   OT Time Calculation (min) 37 min   Activity Tolerance Patient limited by pain   Behavior During Therapy [facility] for tasks assessed/performed      No past medical history on file.  No past surgical history on file.  There were no vitals filed for this visit.  Visit Diagnosis:  Pain of right hand  Stiffness of extremity  Pain of right forearm  Muscle weakness  Wrist stiffness, right      Subjective Assessment - 01/12/15 1724    Subjective  My pain was better, about 2-3/10 until the last 2 days- had to do another job that was a lot of pinching with my thumbs and pain about 4/10 - more my thumb than wrist part - still wearing splints    Patient Stated Goals I want the pain better to use my hand normally    Currently in Pain? Yes   Pain Score 4    Pain Location Hand   Pain Orientation Right   Pain Descriptors / Indicators Aching   Pain Type Chronic pain   Pain Onset More than a month ago   Pain Frequency Constant   Aggravating Factors  using my thumb             [facility] OT Assessment - 01/12/15 0001    AROM   Right Wrist Extension 66 Degrees   Right Wrist Flexion 88 Degrees   Right Wrist Radial Deviation 12 Degrees   Right Wrist Ulnar Deviation 38 Degrees   Strength   Grip (lbs) 15   Right Hand Lateral Pinch 5 lbs   Right Hand 3 Point Pinch 5 lbs   Grip (lbs) 54   Left Hand Lateral Pinch 17 lbs   Left Hand 3  Point Pinch 17 lbs                  OT Treatments/Exercises (OP) - 01/12/15 0001    Hand Exercises   Other Hand Exercises AROM of wrist and thumb in all planes - measurements taken -  increase pain with AROM 3-4/10 and with grip and prehension strength increase to 5-6/10   Iontophoresis   Type of Iontophoresis Dexamethasone   Location 1st dorsal compartment   Dose med patch    Time 18   Splinting   Splinting prefab thumb spica out side of work , neoprene wrap at work                 OT Education - 01/12/15 1730    Education provided Yes   Education Details splint wearing   Person(s) Educated Patient   Methods Explanation;Demonstration;Tactile cues   Comprehension Verbalized understanding;Returned demonstration          OT Short Term Goals - 12/29/14 2000    OT SHORT TERM GOAL #1   Title Pt. will present with increased thumb PA and RA AROM to [facility]  to be able to use thumb in cooking for 10 min without increase of symptoms in    Baseline Still increase pain with use    Time 4   Period Weeks   Status On-going   OT SHORT TERM GOAL #2   Title Pt. will have increased AROM at R wrist to [facility] to do house hold activities    Baseline still limited by pain and wearing splint    Time 4   Period Weeks   Status On-going   OT SHORT TERM GOAL #3   Title Pt. to be indep. with HEP for wearing thumb spica, weaning from it within a few weeks and upgrading HEP with ROM and strengthening    Baseline Wearing splints 24 hrs    Time 4   Period Weeks   Status On-going           OT Long Term Goals - 12/29/14 2001    OT LONG TERM GOAL #1   Title Pt. will have improvement with pain by at least 15 points and function by 15 points on PRWHE in R dominant hand    Baseline Pain still 3/10 at 1 st dorsal compartment; 4/10 pain palmar wrist    Time 4   Period Weeks   Status On-going   OT LONG TERM GOAL #2   Title Grip strength increase to 50% compare to L to be able to do work  without increase symptoms to end of day    Time 4   Status On-going               Plan - 01/12/15 1731    Clinical Impression Statement Pt report decrease pain since last time - but increased the last 2 days because of job she had to use thumb repetitively - some of pain is more that MP of thumb and in webspae - but still positive Finkelstein, and tenderness at distal radius- ionto done this date again and pt to cont with splints    Pt will benefit from skilled therapeutic intervention in order to improve on the following deficits (Retired) Pain;Decreased strength;Impaired flexibility   Rehab Potential Good   OT Frequency 1x / week   OT Duration 2 weeks   OT Treatment/Interventions Iontophoresis;Contrast Bath;Splinting;Therapeutic exercises   OT Home Exercise Plan Cont with splints 24 hrs    Consulted and Agree with Plan of Care Patient        Problem List There are no active problems to display for this patient.   Destynee Stringfellow OTR/L ; CLT 01/12/2015, 5:45 PM   [facility] [address] [place], [place], [address] Phone: [phone]   Fax:  [phone]

## 2015-01-19 ENCOUNTER — Ambulatory Visit: Payer: BLUE CROSS/BLUE SHIELD | Admitting: Occupational Therapy

## 2015-01-19 ENCOUNTER — Encounter: Payer: BLUE CROSS/BLUE SHIELD | Admitting: Occupational Therapy

## 2015-01-19 DIAGNOSIS — M79631 Pain in right forearm: Secondary | ICD-10-CM

## 2015-01-19 DIAGNOSIS — IMO0002 Reserved for concepts with insufficient information to code with codable children: Secondary | ICD-10-CM

## 2015-01-19 DIAGNOSIS — M79641 Pain in right hand: Secondary | ICD-10-CM

## 2015-01-19 DIAGNOSIS — M654 Radial styloid tenosynovitis [de Quervain]: Secondary | ICD-10-CM | POA: Diagnosis not present

## 2015-01-19 DIAGNOSIS — M6281 Muscle weakness (generalized): Secondary | ICD-10-CM

## 2015-01-19 DIAGNOSIS — M25631 Stiffness of right wrist, not elsewhere classified: Secondary | ICD-10-CM

## 2015-01-19 DIAGNOSIS — M2560 Stiffness of unspecified joint, not elsewhere classified: Secondary | ICD-10-CM

## 2015-01-19 NOTE — Patient Instructions (Signed)
Pt can start isometric strengthening for thumb   And then UD with thumb static   2-3 x day and only 6-8 reps  NO increase pain

## 2015-01-19 NOTE — Therapy (Signed)
[place] [facility] [address] [place], [address] Phone: [phone]   Fax:  [phone]  Occupational Therapy Treatment  Patient Details  Name: Ashlee Rios MRN: [id number] Date of Birth: 1976/08/14 Referring Provider:  Evon Slack, PA-C  Encounter Date: 01/19/2015      OT End of Session - 01/19/15 1809    Authorization Type [employer] options   OT Start Time 1715   OT Stop Time 1805   OT Time Calculation (min) 50 min   Activity Tolerance Patient tolerated treatment well   Behavior During Therapy [facility] for tasks assessed/performed      No past medical history on file.  No past surgical history on file.  There were no vitals filed for this visit.  Visit Diagnosis:  Pain of right hand - Plan: Ot plan of care cert/re-cert  Stiffness of extremity - Plan: Ot plan of care cert/re-cert  Pain of right forearm - Plan: Ot plan of care cert/re-cert  Muscle weakness - Plan: Ot plan of care cert/re-cert  Wrist stiffness, right - Plan: Ot plan of care cert/re-cert          [facility] OT Assessment - 01/19/15 0001    ROM / Strength   AROM / PROM / Strength Strength   AROM   Right Wrist Extension 65 Degrees   Right Wrist Flexion 85 Degrees   Right Wrist Radial Deviation 24 Degrees   Right Wrist Ulnar Deviation 38 Degrees   Strength   Right Hand Grip (lbs) 62   Right Hand Lateral Pinch 19 lbs   Right Hand 3 Point Pinch 19 lbs   Left Hand Grip (lbs) 60   Left Hand Lateral Pinch 19 lbs   Left Hand 3 Point Pinch 16 lbs                  OT Treatments/Exercises (OP) - 01/19/15 0001    Hand Exercises   Other Hand Exercises AROM of wrist into UD with thumb static, isometric strenghtening of thumb RA and ADD of thumb to ceiling isometric - 8 reps - pain increase to 310   Other Hand Exercises Assess wrist, thumb ROM as well as grip and prehension    Iontophoresis   Type of  Iontophoresis Dexamethasone   Location 1st dorsal compartment   Dose med patch    Time 18                OT Education - 01/19/15 1756    Education provided Yes   Education Details HEP    Person(s) Educated Patient   Methods Explanation;Demonstration;Verbal cues   Comprehension Verbalized understanding;Returned demonstration;Verbal cues required          OT Short Term Goals - 01/19/15 1801    OT SHORT TERM GOAL #1   Title Pt. will present with increased thumb PA and RA AROM to [facility] to be able to use thumb in cooking for 10 min without increase of symptoms in    Time 4   Period Weeks   Status On-going   OT SHORT TERM GOAL #2   Title Pt. will have increased AROM at R wrist to [facility] to do house hold activities    Baseline still limited by pain and wearing splint    Time 4   Period Weeks   Status On-going   OT SHORT TERM GOAL #3   Title Pt. to be indep. with HEP for wearing thumb spica,  weaning from it within a few weeks and upgrading HEP with ROM and strengthening    Baseline Wearing splints 24 hrs    Time 1   Period Weeks   Status On-going           OT Long Term Goals - 01/19/15 1808    OT LONG TERM GOAL #1   Title Pt. will have improvement with pain by at least 15 points and function by 15 points on PRWHE in R dominant hand    Time 3   Period Weeks   Status On-going   OT LONG TERM GOAL #2   Title Grip strength increase to 50% compare to L to be able to do work without increase symptoms to end of day    Time 4   Period Weeks   Status On-going               Plan - 01/19/15 1757    Clinical Impression Statement Pt pain decrease to about 2/10 - at the worse duing testing was 3/10 - grip and prehension strength increase as well as wrist AROM - able to initiate isometric strenghtening  but pt still to wear splnts - plan to decrease splint wearing next week if pain better and able to tolerare HEP    Pt will benefit from skilled therapeutic intervention in  order to improve on the following deficits (Retired) Pain;Decreased strength;Impaired flexibility   Rehab Potential Good   OT Frequency 1x / week   OT Duration 4 weeks   OT Treatment/Interventions Iontophoresis;Contrast Bath;Splinting;Therapeutic exercises   Plan Pain , weaning out of splint if pain free - and add more strenghtening    OT Home Exercise Plan Cont with splints 24 hrs    Consulted and Agree with Plan of Care Patient        Problem List There are no active problems to display for this patient.   Oletta CohnuPreez, Marabeth Melland  OTR/L, CLT 01/19/2015, 6:16 PM  [place] [facility] [address] , [place], [address] Phone: [phone]   Fax:  [phone]

## 2015-01-26 ENCOUNTER — Ambulatory Visit: Payer: BLUE CROSS/BLUE SHIELD | Attending: Orthopedic Surgery | Admitting: Occupational Therapy

## 2015-01-26 DIAGNOSIS — M79631 Pain in right forearm: Secondary | ICD-10-CM | POA: Insufficient documentation

## 2015-01-26 DIAGNOSIS — IMO0002 Reserved for concepts with insufficient information to code with codable children: Secondary | ICD-10-CM

## 2015-01-26 DIAGNOSIS — M25631 Stiffness of right wrist, not elsewhere classified: Secondary | ICD-10-CM | POA: Diagnosis present

## 2015-01-26 DIAGNOSIS — M6281 Muscle weakness (generalized): Secondary | ICD-10-CM | POA: Insufficient documentation

## 2015-01-26 DIAGNOSIS — M2560 Stiffness of unspecified joint, not elsewhere classified: Secondary | ICD-10-CM | POA: Diagnosis present

## 2015-01-26 DIAGNOSIS — M79641 Pain in right hand: Secondary | ICD-10-CM | POA: Insufficient documentation

## 2015-01-26 NOTE — Therapy (Signed)
[place] [facility] [address] [place], [place], [address] Phone: [phone]   Fax:  [phone]  Occupational Therapy Treatment  Patient Details  Name: Ashlee AuMirna D Guardado Carabantes MRN: [id number] Date of Birth: [birth year] Referring Provider:  Evon SlackGaines, Thomas C, PA-C  Encounter Date: 01/26/2015      OT End of Session - 01/26/15 1811    Visit Number 12   Number of Visits 16   Date for OT Re-Evaluation 02/16/15   Authorization Type [employer] BLue options   OT Start Time 1723   OT Stop Time 1803   OT Time Calculation (min) 40 min   Activity Tolerance Patient tolerated treatment well   Behavior During Therapy [facility] for tasks assessed/performed      No past medical history on file.  No past surgical history on file.  There were no vitals filed for this visit.  Visit Diagnosis:  Pain of right hand  Stiffness of extremity  Pain of right forearm  Muscle weakness  Wrist stiffness, right      Subjective Assessment - 01/26/15 1802    Subjective  I have increase pain at my thumb - at the middle joint - tender when push about 5-6/10 and with movement 3/.10 - had to take my splnt off to do something at home and felt pull -   Patient Stated Goals I want the pain better to use my hand normally    Currently in Pain? Yes   Pain Score 3    Pain Location Hand   Pain Orientation Right   Pain Descriptors / Indicators Aching   Pain Type Chronic pain   Pain Onset More than a month ago   Pain Frequency Constant                      OT Treatments/Exercises (OP) - 01/26/15 0001    Exercises   Exercises Hand;Wrist   Hand Exercises   Other Hand Exercises AROM of wrist into UD with thumb static, AROM for sup/pro 1 lbs weight , isometric strenghtening for UD and wrist ext - no pain at add to HEP 8-10 reps    Other Hand Exercises Assess pain with AROM of thumb and wrist prior to ther ex    Iontophoresis   Type of  Iontophoresis Dexamethasone   Location 1st dorsal compartment   Dose Ionto patch 1 ml slow release 4 hrs one - pt ed on taking off in 4 hours    Time 8                OT Education - 01/26/15 1810    Education provided Yes   Education Details HEP   Person(s) Educated Patient   Methods Explanation;Demonstration;Verbal cues   Comprehension Verbalized understanding;Returned demonstration;Tactile cues required          OT Short Term Goals - 01/19/15 1801    OT SHORT TERM GOAL #1   Title Pt. will present with increased thumb PA and RA AROM to [facility] to be able to use thumb in cooking for 10 min without increase of symptoms in    Time 4   Period Weeks   Status On-going   OT SHORT TERM GOAL #2   Title Pt. will have increased AROM at R wrist to [facility] to do house hold activities    Baseline still limited by pain and wearing splint    Time 4   Period Weeks   Status On-going  OT SHORT TERM GOAL #3   Title Pt. to be indep. with HEP for wearing thumb spica, weaning from it within a few weeks and upgrading HEP with ROM and strengthening    Baseline Wearing splints 24 hrs    Time 1   Period Weeks   Status On-going           OT Long Term Goals - 01/19/15 1808    OT LONG TERM GOAL #1   Title Pt. will have improvement with pain by at least 15 points and function by 15 points on PRWHE in R dominant hand    Time 3   Period Weeks   Status On-going   OT LONG TERM GOAL #2   Title Grip strength increase to 50% compare to L to be able to do work without increase symptoms to end of day    Time 4   Period Weeks   Status On-going               Plan - 01/26/15 1814    Clinical Impression Statement Pt had increase pain at MP of thumb this date - could be because of isomtrics of thumb HEP - discharged those and added isomtric 2 for wrist and 1 lbs for sup/pro - and did ionto slow release patch and pt to try wean little out of hard splint will asses pain next session again    Pt  will benefit from skilled therapeutic intervention in order to improve on the following deficits (Retired) Pain;Decreased strength;Impaired flexibility   Rehab Potential Good   OT Frequency 1x / week   OT Duration 4 weeks   OT Treatment/Interventions Iontophoresis;Contrast Bath;Splinting;Therapeutic exercises   Plan assess pain and HEP    OT Home Exercise Plan Cont with splints 24 hrs but can wean between hard and soft one   Consulted and Agree with Plan of Care Patient        Problem List There are no active problems to display for this patient.   Aerika Groll OTR/L, CLT 01/26/2015, 6:18 PM  [place] [facility] PHYSICAL AND SPORTS MEDICINE [address] [place], [address] Phone: [phone]   Fax:  [phone]

## 2015-01-26 NOTE — Patient Instructions (Signed)
To take off slow release ionto patch in 4hrs off  Can try and sleep with soft splint - report with hard one her thumb stiff in am   And can wean at home 1 hr on and off hard one and then soft splint - if increase pain - back  In hard   HEP to stop isometric for thumb   UD with thumb static , 1 lbs for sup/pro; UD  isometric , and isometric wrist extention  In neutral  8-10 reps add to HEP

## 2015-02-02 ENCOUNTER — Ambulatory Visit: Payer: BLUE CROSS/BLUE SHIELD | Admitting: Occupational Therapy

## 2015-02-02 DIAGNOSIS — M79641 Pain in right hand: Secondary | ICD-10-CM

## 2015-02-02 DIAGNOSIS — M2560 Stiffness of unspecified joint, not elsewhere classified: Secondary | ICD-10-CM

## 2015-02-02 DIAGNOSIS — IMO0002 Reserved for concepts with insufficient information to code with codable children: Secondary | ICD-10-CM

## 2015-02-02 DIAGNOSIS — M79631 Pain in right forearm: Secondary | ICD-10-CM

## 2015-02-02 DIAGNOSIS — M6281 Muscle weakness (generalized): Secondary | ICD-10-CM

## 2015-02-02 DIAGNOSIS — M25631 Stiffness of right wrist, not elsewhere classified: Secondary | ICD-10-CM

## 2015-02-02 NOTE — Patient Instructions (Signed)
Wean at evening and weekend gradually to soft splints   Sleep with wrist Benik splint   Add isometric for UD , ext and flexion for wrist Isometric add for thumb RA

## 2015-02-02 NOTE — Therapy (Signed)
[place] [facility] [address] [place], [address] Phone: [phone]   Fax:  [phone]  Occupational Therapy Treatment  Patient Details  Name: Ashlee Rios MRN: [id number] Date of Birth: February 24, 1976 Referring Provider:  Evon Slack, PA-C  Encounter Date: 02/02/2015      OT End of Session - 02/02/15 1815    Visit Number 13   Number of Visits 16   Date for OT Re-Evaluation 02/16/15   Authorization Type [employer] BL[employer] options   OT Start Time 1724   OT Stop Time 1816   OT Time Calculation (min) 52 min   Activity Tolerance Patient tolerated treatment well   Behavior During Therapy [facility] for tasks assessed/performed      No past medical history on file.  No past surgical history on file.  There were no vitals filed for this visit.  Visit Diagnosis:  Pain of right hand  Stiffness of extremity  Pain of right forearm  Muscle weakness  Wrist stiffness, right      Subjective Assessment - 02/02/15 1806    Subjective  Pain is better , about 2/10 at my middle jioint of thumb - but more tender on the bone - still wear hard splint at night - the soft one my wrist hurt more, and I still wear hard one after work - wrist not hurting but tender if you push on it    Patient Stated Goals I want the pain better to use my hand normally    Currently in Pain? Yes   Pain Score 2    Pain Location Hand   Pain Orientation Right   Pain Descriptors / Indicators Aching   Pain Onset More than a month ago                      OT Treatments/Exercises (OP) - 02/02/15 0001    Wrist Exercises   Other wrist exercises Isometric for wrist flexion, extention, UD     Other wrist exercises 1lbs weight for sup/pro    Hand Exercises   Other Hand Exercises Thumb RA isometric add   Ultrasound   Ultrasound Location MP of thumb    Ultrasound Parameters 3. at  20% and 1.0 intensity  for 4 min    Ultrasound Goals Pain   Iontophoresis   Type of Iontophoresis Dexamethasone   Location 1st dorsal compartment   Dose Ionto patch 1 ml slow release 4 hrs one - pt ed on taking off in 4 hours    Time 8   Splinting   Splinting pt to wear  Benik wrist neoprene at night time , work neoprene wrist and thumb one , and start wearing soft one in evening and over weekend more graudally and wean out of hard one                 OT Education - 02/02/15 1815    Education provided Yes   Education Details HEP   Person(s) Educated Patient   Methods Explanation;Demonstration;Verbal cues   Comprehension Verbalized understanding;Returned demonstration;Verbal cues required          OT Short Term Goals - 02/02/15 1821    OT SHORT TERM GOAL #1   Title Pt. will present with increased thumb PA and RA AROM to [facility] to be able to use thumb in cooking for 10 min without increase of symptoms in    Baseline Still increase pain with use  Time 2   Period Weeks   Status On-going   OT SHORT TERM GOAL #2   Title Pt. will have increased AROM at R wrist to [facility] to do house hold activities    Baseline still limited by pain and wearing splint    Time 2   Period Weeks   Status On-going   OT SHORT TERM GOAL #3   Title Pt. to be indep. with HEP for wearing thumb spica, weaning from it within a few weeks and upgrading HEP with ROM and strengthening    Status Achieved           OT Long Term Goals - 02/02/15 1822    OT LONG TERM GOAL #1   Title Pt. will have improvement with pain by at least 15 points and function by 15 points on PRWHE in R dominant hand    Baseline Pain still 2/10 - next time PRHWE do   Time 3   Period Weeks   Status On-going   OT LONG TERM GOAL #2   Title Grip strength increase to 50% compare to L to be able to do work without increase symptoms to end of day    Time 2   Period Weeks   Status On-going               Plan - 02/02/15 1816    Clinical Impression Statement  Pt showed decrease pain - but still at MP and 1st dorsal but more tenderness - upgrade some HEP and pt to wean gradually out of hard splnt to soft neoprene  - pt's work do requires repetitive lateral grip and pull - cont to decrease pain , wean out of splint and strenghtening    Pt will benefit from skilled therapeutic intervention in order to improve on the following deficits (Retired) Pain;Decreased strength;Impaired flexibility   Rehab Potential Good   OT Frequency 1x / week   OT Duration 4 weeks   OT Treatment/Interventions Iontophoresis;Contrast Bath;Splinting;Therapeutic exercises   OT Home Exercise Plan Cont with splints 24 hrs but can wean between hard and soft one   Consulted and Agree with Plan of Care Patient        Problem List There are no active problems to display for this patient.   Vanita Cannell OTR/L, CLT 02/02/2015, 6:24 PM  [place] [facility]TS MEDICINE [address], [place], [address] Phone: [phone]   Fax:  [phone]

## 2015-02-09 ENCOUNTER — Ambulatory Visit: Payer: BLUE CROSS/BLUE SHIELD | Admitting: Occupational Therapy

## 2015-02-09 DIAGNOSIS — M79641 Pain in right hand: Secondary | ICD-10-CM

## 2015-02-09 DIAGNOSIS — IMO0002 Reserved for concepts with insufficient information to code with codable children: Secondary | ICD-10-CM

## 2015-02-09 DIAGNOSIS — M2560 Stiffness of unspecified joint, not elsewhere classified: Secondary | ICD-10-CM

## 2015-02-09 DIAGNOSIS — M6281 Muscle weakness (generalized): Secondary | ICD-10-CM

## 2015-02-09 DIAGNOSIS — M79631 Pain in right forearm: Secondary | ICD-10-CM

## 2015-02-09 NOTE — Therapy (Signed)
[place] [facility] [address], [place], [address] Phone: [phone]   Fax:  [phone]  Occupational Therapy Treatment  Patient Details  Name: Ashlee Rios MRN: [id number] Date of Birth: 12/25/1975 Referring Provider:  Evon Slack, PA-C  Encounter Date: 02/09/2015      OT End of Session - 02/09/15 1830    Visit Number 14   Number of Visits 16   Date for OT Re-Evaluation 02/16/15   Authorization Type BC[employer] BLue options   OT Start Time 1725   OT Stop Time 1824   OT Time Calculation (min) 59 min   Activity Tolerance Patient limited by pain   Behavior During Therapy [facility] for tasks assessed/performed      No past medical history on file.  No past surgical history on file.  There were no vitals filed for this visit.  Visit Diagnosis:  Pain of right hand  Stiffness of extremity  Pain of right forearm  Muscle weakness      Subjective Assessment - 02/09/15 1820    Subjective  Pain is better - still bother me at work - about 4/10 pain  but at home about 1-2/10 - still wearing soft splint at work and then hard one at home except wtih cooking or bathing - sleep only with wrist soft one - I did the exercises - My lawyer was asking about how much more therapy , and percentage of strength in my hands - and what I can pick up - and did not know if insurance is going to cover it - so I don't know    Patient is accompained by: Interpreter   Repetition Increases Symptoms   Patient Stated Goals I want the pain better to use my hand normally    Currently in Pain? Yes   Pain Score 2    Pain Location Hand   Pain Orientation Right   Pain Descriptors / Indicators Aching   Pain Type Chronic pain   Pain Onset More than a month ago   Pain Frequency Intermittent   Aggravating Factors  using thumb             [facility] OT Assessment - 02/09/15 0001    Strength   Right Hand Grip (lbs) 30   Right  Hand Lateral Pinch 19 lbs   Right Hand 3 Point Pinch 10 lbs      Grip decrease from 62 lbs , 3 point too - see flowsheet            OT Treatments/Exercises (OP) - 02/09/15 0001    Wrist Exercises   Other wrist exercises AROM of wrist pain free - did 1 lbs for wrist sup/pro - need verbal cues not to flex wrist, atttempted 1 lbs for wrist extention but increase pain 3/10 at 1st dorsal compartent ; Cont with lisometric for wrist flexion at neutral , but add wrist extention end range isometric    Other wrist exercises Cont with UD neutral isometric    Hand Exercises   Other Hand Exercises Attempted thumb PA with light blue putty but had increase pain at st dorsal compartment    Other Hand Exercises AROM of thumb was painfree - add end range PA and RA isometric  pain free    Splinting   Splinting  Pt stil kept hard splint on at home - pt to wear soft splint at home 50% of time and nothing and gradually decrease wear  of soft one at home - and assess pain - pt afraid about insurance not covering if she do not wear splint at home but at work -                 OT Education - 02/09/15 1830    Education provided Yes   Education Details HEP   Person(s) Educated Patient   Methods Explanation;Demonstration;Tactile cues;Handout   Comprehension Verbalized understanding;Returned demonstration;Verbal cues required          OT Short Term Goals - 02/02/15 1821    OT SHORT TERM GOAL #1   Title Pt. will present with increased thumb PA and RA AROM to [facility] to be able to use thumb in cooking for 10 min without increase of symptoms in    Baseline Still increase pain with use    Time 2   Period Weeks   Status On-going   OT SHORT TERM GOAL #2   Title Pt. will have increased AROM at R wrist to [facility] to do house hold activities    Baseline still limited by pain and wearing splint    Time 2   Period Weeks   Status On-going   OT SHORT TERM GOAL #3   Title Pt. to be indep. with HEP for  wearing thumb spica, weaning from it within a few weeks and upgrading HEP with ROM and strengthening    Status Achieved           OT Long Term Goals - 02/02/15 1822    OT LONG TERM GOAL #1   Title Pt. will have improvement with pain by at least 15 points and function by 15 points on PRWHE in R dominant hand    Baseline Pain still 2/10 - next time PRHWE do   Time 3   Period Weeks   Status On-going   OT LONG TERM GOAL #2   Title Grip strength increase to 50% compare to L to be able to do work without increase symptoms to end of day    Time 2   Period Weeks   Status On-going               Plan - 02/09/15 1831    Clinical Impression Statement Pt cont to show some decrease pain - did not test positive for Finkelstein compare to last week still - but was unable to tolerate wrist extention with 1 lbs and soft putty for thumb PA - cont but upgrade isometric strenthening at end range for thumb and wrist extention - and pt to wear soft splints more  and  at home 50% or more without splint - also pt to make appt with MD because she is going on 8 wks of therapy and progress still liimitng by pain  - and work requires repetitive thumb use and pick up 20to 30 lbs per pt    Pt will benefit from skilled therapeutic intervention in order to improve on the following deficits (Retired) Pain;Decreased strength;Impaired flexibility   Rehab Potential Good   OT Frequency 1x / week   OT Duration 2 weeks   OT Treatment/Interventions Therapeutic exercise;Splinting   Plan did pt make appt with MD    Consulted and Agree with Plan of Care Patient     Discuss with pt progress slow because after last shot she had flare up symptoms and had to do immobilization longer and ionto again  - and her work require her to use hands physically - pinching with  thumb and pick up per pt 20-30 lbs    Problem List There are no active problems to display for this patient.   Oletta Cohn OTR/L, CLT 02/09/2015,  6:39 PM  [place] [facility] PH[facility]ORTS MEDICINE [address], [place], [address] Phone: [phone]   Fax:  [phone]

## 2015-02-09 NOTE — Patient Instructions (Signed)
Soft thumb neoprene and no splint at home in evening and weekend  NO hard splint anymore   Thumb PA and RA end range isometric Isometric wrist extention end range FLexion and UD of wrist in neutral with isometric

## 2015-02-16 ENCOUNTER — Ambulatory Visit: Payer: BLUE CROSS/BLUE SHIELD | Admitting: Occupational Therapy

## 2015-02-16 DIAGNOSIS — M79641 Pain in right hand: Secondary | ICD-10-CM | POA: Diagnosis not present

## 2015-02-16 DIAGNOSIS — M79631 Pain in right forearm: Secondary | ICD-10-CM

## 2015-02-16 DIAGNOSIS — M6281 Muscle weakness (generalized): Secondary | ICD-10-CM

## 2015-02-16 DIAGNOSIS — M25631 Stiffness of right wrist, not elsewhere classified: Secondary | ICD-10-CM

## 2015-02-16 DIAGNOSIS — IMO0002 Reserved for concepts with insufficient information to code with codable children: Secondary | ICD-10-CM

## 2015-02-16 DIAGNOSIS — M2560 Stiffness of unspecified joint, not elsewhere classified: Secondary | ICD-10-CM

## 2015-02-16 NOTE — Therapy (Signed)
[place] [facility] PH[facility]TS MEDICINE [address], [place], [address] Phone: [phone]   Fax:  [phone]  Occupational Therapy Treatment  Patient Details  Name: Ashlee Rios MRN: [id number] Date of Birth: [birth year] Referring Provider:  Evon Slack, PA-C  Encounter Date: 02/16/2015      OT End of Session - 02/16/15 1828    Visit Number 15   Number of Visits 16   Date for OT Re-Evaluation 02/16/15   Authorization Type [employer] BL[employer] options   OT Start Time 1726   OT Stop Time 1806   OT Time Calculation (min) 40 min   Activity Tolerance Patient tolerated treatment well   Behavior During Therapy [facility] for tasks assessed/performed      No past medical history on file.  No past surgical history on file.  There were no vitals filed for this visit.  Visit Diagnosis:  Pain of right hand  Stiffness of extremity  Pain of right forearm  Muscle weakness  Wrist stiffness, right      Subjective Assessment - 02/16/15 1820    Subjective  It was really good this week - pain at the worse about 2/10, wore the splint very little but at work I do, using it at home - only thing that is little hard to cutting with knife and fastening bra behind my back    Patient is accompained by: Interpreter   Patient Stated Goals I want the pain better to use my hand normally    Currently in Pain? No/denies                      OT Treatments/Exercises (OP) - 02/16/15 0001    ADLs   ADL Comments PRWHE done - for pain 7/50; function 1.5/50    Wrist Exercises   Other wrist exercises Assess no pain with isometric strenghening of wrist in all planes and thumb - only slight pull at 1st dorsal compartment during UD    Other wrist exercises Isometric end range for UD , flexion and extention , 1 lbs weight for sup/pro , and can add to wrist ext/flexion in 3 days if not increase pain    Hand Exercises   Other Hand Exercises  Attempted PA , RA and flexion of thumb with putty - but  some discomfort - cont with isometric for thumb RA                 OT Education - 02/16/15 1827    Education provided Yes   Education Details HEP   Person(s) Educated Patient;Child(ren)   Methods Explanation;Demonstration;Tactile cues;Verbal cues;Handout   Comprehension Verbalized understanding;Returned demonstration;Verbal cues required          OT Short Term Goals - 02/16/15 1830    OT SHORT TERM GOAL #1   Title Pt. will present with increased thumb PA and RA AROM to [facility] to be able to use thumb in cooking for 10 min without increase of symptoms in    Status Achieved   OT SHORT TERM GOAL #2   Title Pt. will have increased AROM at R wrist to [facility] to do house hold activities    Status Achieved   OT SHORT TERM GOAL #3   Title Pt. to be indep. with HEP for wearing thumb spica, weaning from it within a few weeks and upgrading HEP with ROM and strengthening    Status Achieved  OT Long Term Goals - 27-Feb-2015 1831    OT LONG TERM GOAL #1   Title Pt. will have improvement with pain by at least 15 points and function by 15 points on PRWHE in R dominant hand    Baseline PRWHE for pain 7/50, function 1.5/50   Status Achieved   OT LONG TERM GOAL #2   Title Grip strength increase to 50% compare to L to be able to do work without increase symptoms to end of day    Status Achieved               Plan - 27-Feb-2015 1829    Clinical Impression Statement Pt arrive this date with less pain , wearing soft neoprene splint only at work - using hand normally at home  with pain at the wrose about 2/10 - HEP upgraded and pt to cont with HEP and will phone me in 2 wks if need to return to therapy otherwise will discharge her    Pt will benefit from skilled therapeutic intervention in order to improve on the following deficits (Retired) Pain;Decreased strength;Impaired flexibility   Rehab Potential Good   OT  Treatment/Interventions Therapeutic exercise;Splinting   Consulted and Agree with Plan of Care Patient          G-Codes - 2015-02-27 1832    Functional Assessment Tool Used PRWHE, grip and cllincal assessment   Functional Limitation Self care   Self Care Current Status (Z6109) At least 1 percent but less than 20 percent impaired, limited or restricted   Self Care Goal Status (U0454) At least 1 percent but less than 20 percent impaired, limited or restricted      Problem List There are no active problems to display for this patient.   Oletta Cohn OTR/L CLT 02/27/2015, 6:34 PM  [place] [facility] PH[facility]TS MEDICINE [address] [place], [address] Phone: [phone]   Fax:  [phone]

## 2015-02-16 NOTE — Patient Instructions (Signed)
See there ex in flowsheet

## 2016-10-21 ENCOUNTER — Encounter: Payer: Self-pay | Admitting: *Deleted

## 2016-10-21 ENCOUNTER — Emergency Department: Payer: BLUE CROSS/BLUE SHIELD

## 2016-10-21 ENCOUNTER — Observation Stay
Admission: EM | Admit: 2016-10-21 | Discharge: 2016-10-22 | Disposition: A | Discharge status: Home / Self Care | Payer: BLUE CROSS/BLUE SHIELD | Attending: Internal Medicine | Admitting: Internal Medicine

## 2016-10-21 DIAGNOSIS — R748 Abnormal levels of other serum enzymes: Secondary | ICD-10-CM | POA: Diagnosis present

## 2016-10-21 DIAGNOSIS — R7989 Other specified abnormal findings of blood chemistry: Secondary | ICD-10-CM

## 2016-10-21 DIAGNOSIS — G43909 Migraine, unspecified, not intractable, without status migrainosus: Secondary | ICD-10-CM | POA: Insufficient documentation

## 2016-10-21 DIAGNOSIS — Z6832 Body mass index (BMI) 32.0-32.9, adult: Secondary | ICD-10-CM | POA: Diagnosis not present

## 2016-10-21 DIAGNOSIS — E669 Obesity, unspecified: Secondary | ICD-10-CM | POA: Insufficient documentation

## 2016-10-21 DIAGNOSIS — R778 Other specified abnormalities of plasma proteins: Secondary | ICD-10-CM | POA: Insufficient documentation

## 2016-10-21 DIAGNOSIS — R079 Chest pain, unspecified: Principal | ICD-10-CM | POA: Diagnosis present

## 2016-10-21 DIAGNOSIS — F419 Anxiety disorder, unspecified: Secondary | ICD-10-CM | POA: Insufficient documentation

## 2016-10-21 HISTORY — DX: Migraine, unspecified, not intractable, without status migrainosus: G43.909

## 2016-10-21 LAB — CBC WITH DIFFERENTIAL/PLATELET
BASOS ABS: 0.1 10*3/uL (ref 0–0.1)
Basophils Relative: 1 %
Eosinophils Absolute: 0.2 10*3/uL (ref 0–0.7)
Eosinophils Relative: 2 %
HEMATOCRIT: 39.2 % (ref 35.0–47.0)
Hemoglobin: 13.6 g/dL (ref 12.0–16.0)
Lymphocytes Relative: 46 %
Lymphs Abs: 4.3 10*3/uL — ABNORMAL HIGH (ref 1.0–3.6)
MCH: 29.1 pg (ref 26.0–34.0)
MCHC: 34.8 g/dL (ref 32.0–36.0)
MCV: 83.7 fL (ref 80.0–100.0)
Monocytes Absolute: 0.7 10*3/uL (ref 0.2–0.9)
Monocytes Relative: 7 %
NEUTROS ABS: 4 10*3/uL (ref 1.4–6.5)
Neutrophils Relative %: 44 %
Platelets: 287 10*3/uL (ref 150–440)
RBC: 4.68 MIL/uL (ref 3.80–5.20)
RDW: 13.5 % (ref 11.5–14.5)
WBC: 9.2 10*3/uL (ref 3.6–11.0)

## 2016-10-21 LAB — COMPREHENSIVE METABOLIC PANEL
ALT: 32 U/L (ref 14–54)
AST: 38 U/L (ref 15–41)
Albumin: 4.5 g/dL (ref 3.5–5.0)
Alkaline Phosphatase: 52 U/L (ref 38–126)
Anion gap: 9 (ref 5–15)
BILIRUBIN TOTAL: 0.6 mg/dL (ref 0.3–1.2)
BUN: 12 mg/dL (ref 6–20)
CHLORIDE: 106 mmol/L (ref 101–111)
CO2: 22 mmol/L (ref 22–32)
CREATININE: 0.64 mg/dL (ref 0.44–1.00)
Calcium: 9 mg/dL (ref 8.9–10.3)
GFR calc Af Amer: >60 mL/min (ref >60)
Glucose, Bld: 161 mg/dL — ABNORMAL HIGH (ref 65–99)
Potassium: 3.5 mmol/L (ref 3.5–5.1)
Sodium: 137 mmol/L (ref 135–145)
TOTAL PROTEIN: 8.6 g/dL — AB (ref 6.5–8.1)

## 2016-10-21 LAB — URINALYSIS, COMPLETE (UACMP) WITH MICROSCOPIC
Bilirubin Urine: NEGATIVE
Glucose, UA: NEGATIVE mg/dL
Ketones, ur: NEGATIVE mg/dL
Leukocytes, UA: NEGATIVE
NITRITE: NEGATIVE
PROTEIN: NEGATIVE mg/dL
Specific Gravity, Urine: 1.008 (ref 1.005–1.030)
pH: 6 (ref 5.0–8.0)

## 2016-10-21 LAB — TROPONIN I
TROPONIN I: 0.37 ng/mL — AB (ref <0.03)
Troponin I: 0.59 ng/mL (ref <0.03)
Troponin I: 0.46 ng/mL

## 2016-10-21 LAB — HCG, QUANTITATIVE, PREGNANCY: hCG, Beta Chain, Quant, S: <1 m[IU]/mL (ref <5)

## 2016-10-21 LAB — FIBRIN DERIVATIVES D-DIMER (ARMC ONLY): Fibrin derivatives D-dimer (ARMC): 230 (ref 0–499)

## 2016-10-21 IMAGING — CT CT HEAD W/O CM
3 series · 15 of 46 positions shown, 18 images · non-contrast
Comparison: None.

CLINICAL DATA: Syncopal episode with headaches

EXAM:
CT HEAD WITHOUT CONTRAST
TECHNIQUE: Contiguous axial images were obtained from the base of the skull
through the vertex without intravenous contrast.

[Series 2: head wo · axial · 0.47mm/px · z∈[-132,-12]mm · 9 of 29 slices shown, 12 images]
[im 3/29  brain]
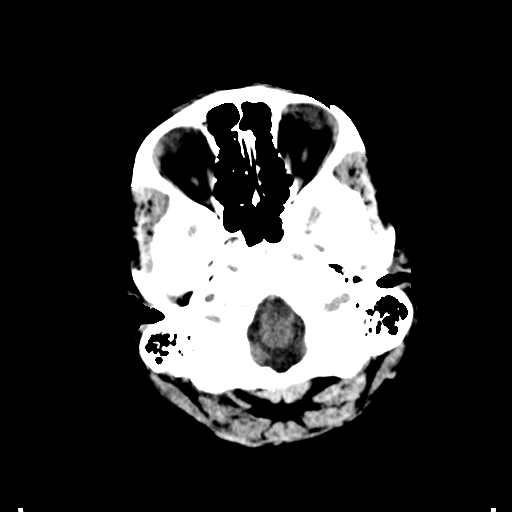
[im 3/29  bone]
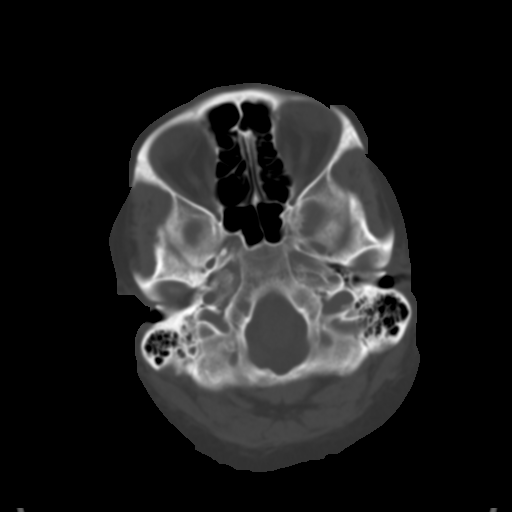
[im 6/29  brain]
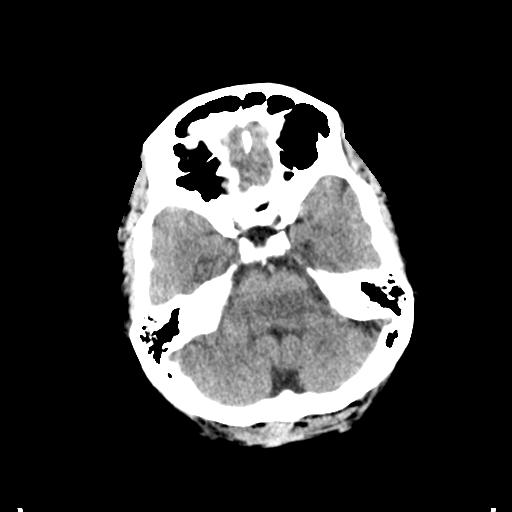
[im 9/29  brain]
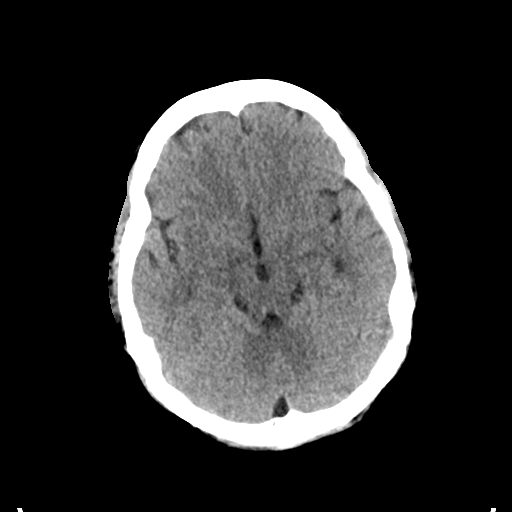
[im 12/29  brain]
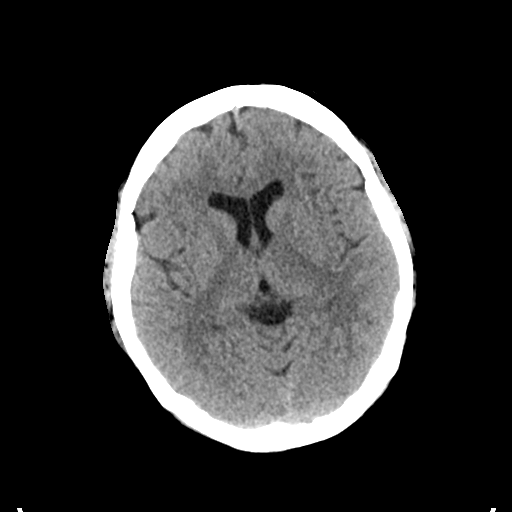
[im 15/29  brain]
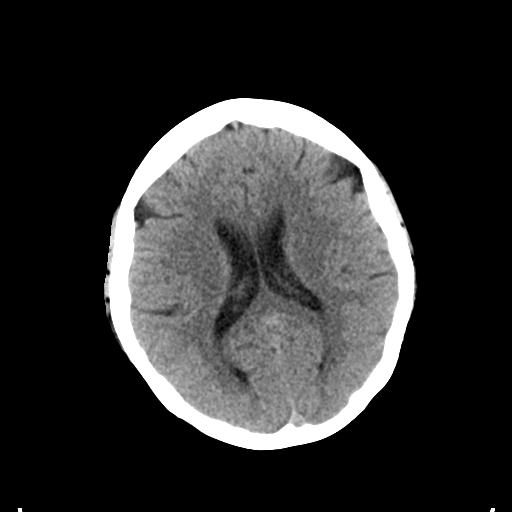
[im 15/29  bone]
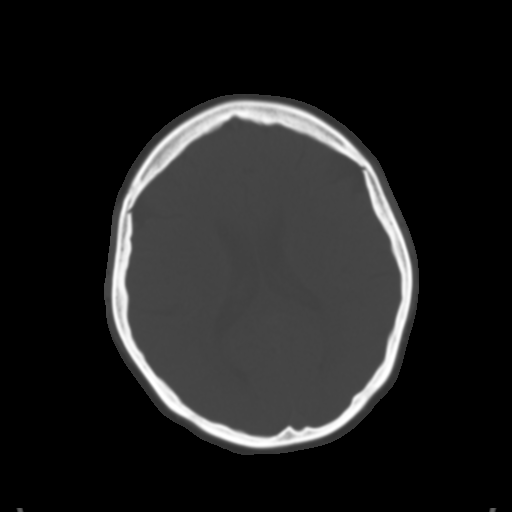
[im 18/29  brain]
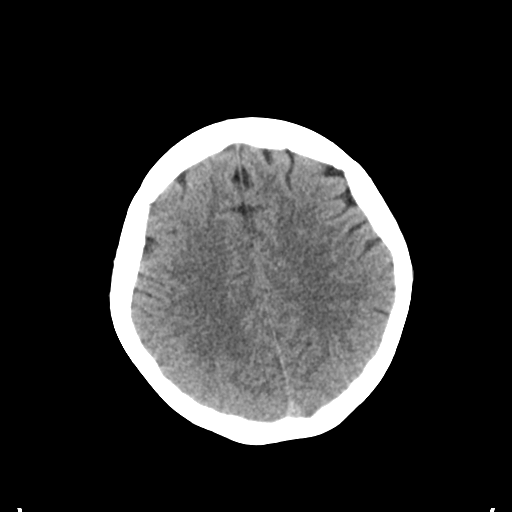
[im 21/29  brain]
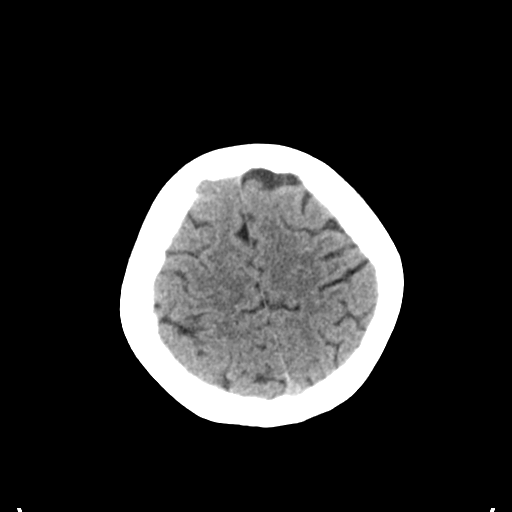
[im 24/29  brain]
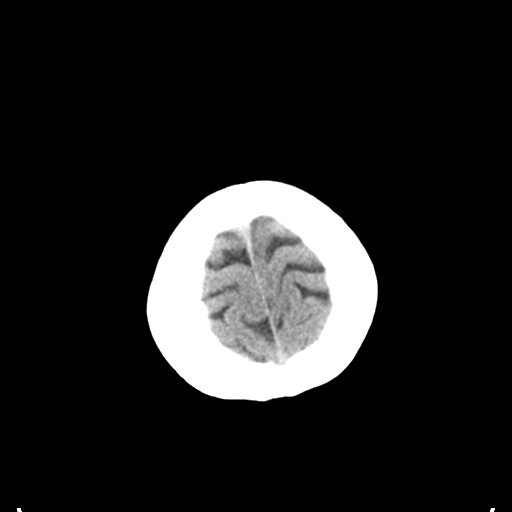
[im 27/29  brain]
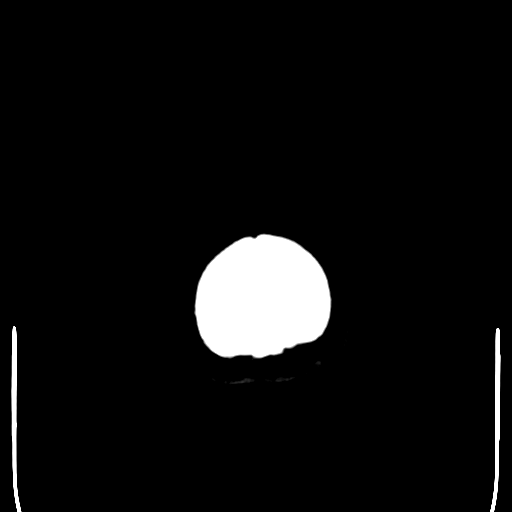
[im 27/29  bone]
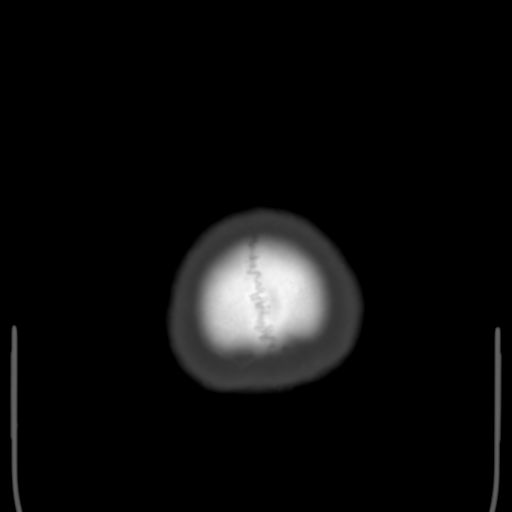

[Series 4: coronal soft tissue · coronal · 0.29mm/px · 3 of 57 slices shown]
[im 19/57  brain]
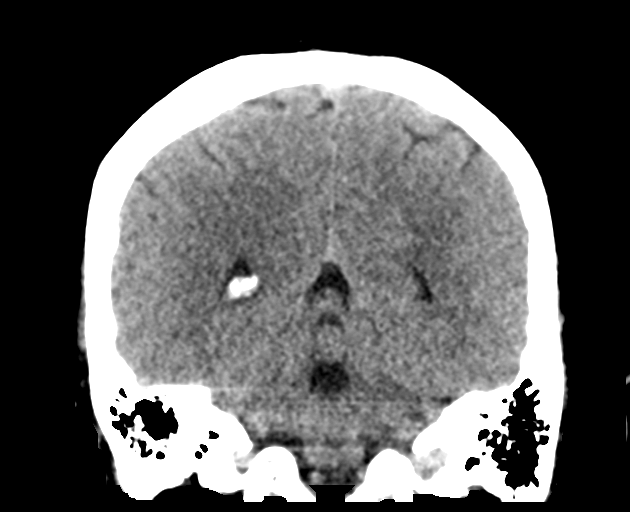
[im 25/57  brain]
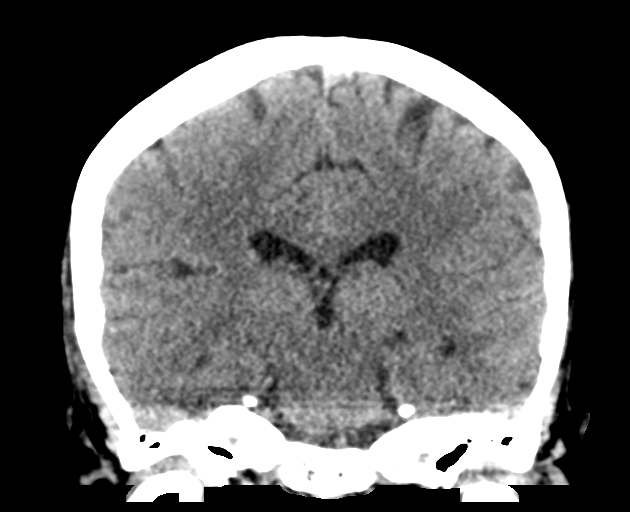
[im 32/57  brain]
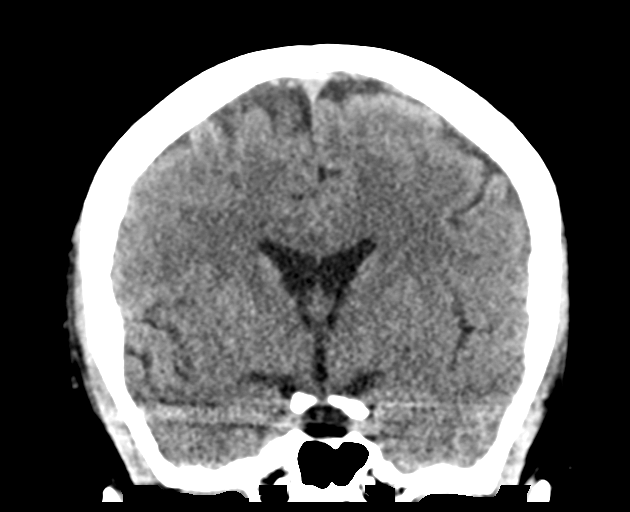

[Series 5: sagittal soft tissue · sagittal · 0.29mm/px · 3 of 53 slices shown]
[im 18/53  brain]
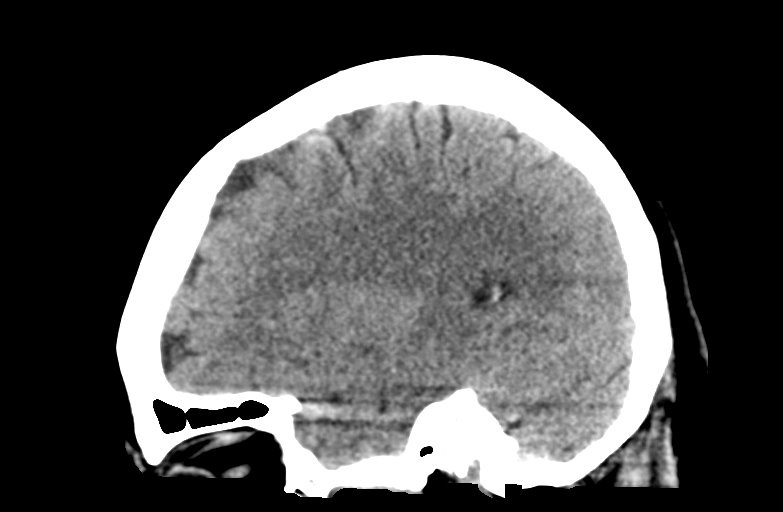
[im 27/53  brain]
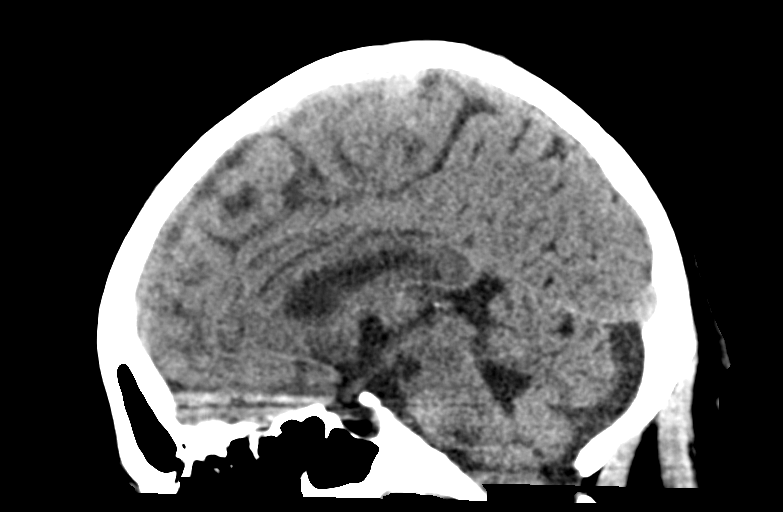
[im 35/53  brain]
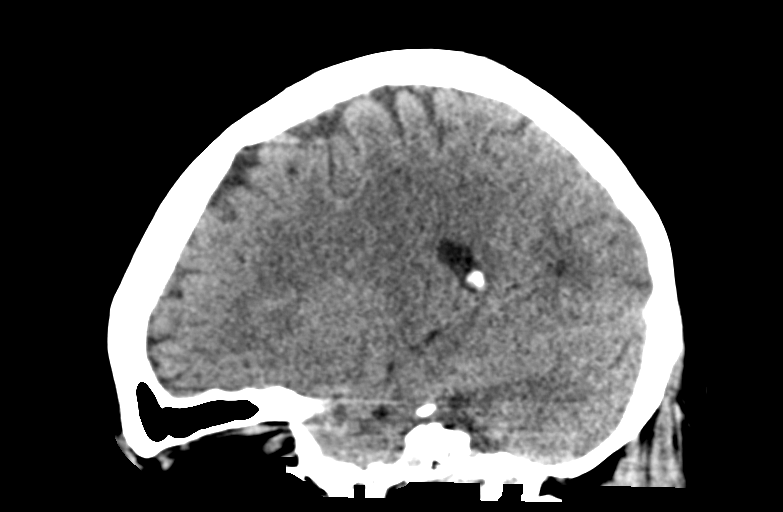

[15 of 46 positions shown; findings below may reference images not displayed]

FINDINGS: Brain: No evidence of acute infarction, hemorrhage, hydrocephalus,
extra-axial collection or mass lesion/mass effect.

Vascular: No hyperdense vessel or unexpected calcification.

Skull: Normal. Negative for fracture or focal lesion.

Sinuses/Orbits: No acute finding.

Other: None.
IMPRESSION: No acute abnormality noted.

## 2016-10-21 IMAGING — CR DG CHEST 1V PORT
1 series · 1 of 1 positions shown · non-contrast
Comparison: 10/11/2014

CLINICAL DATA: Acute chest pain.

EXAM:
PORTABLE CHEST 1 VIEW

[chest ap]
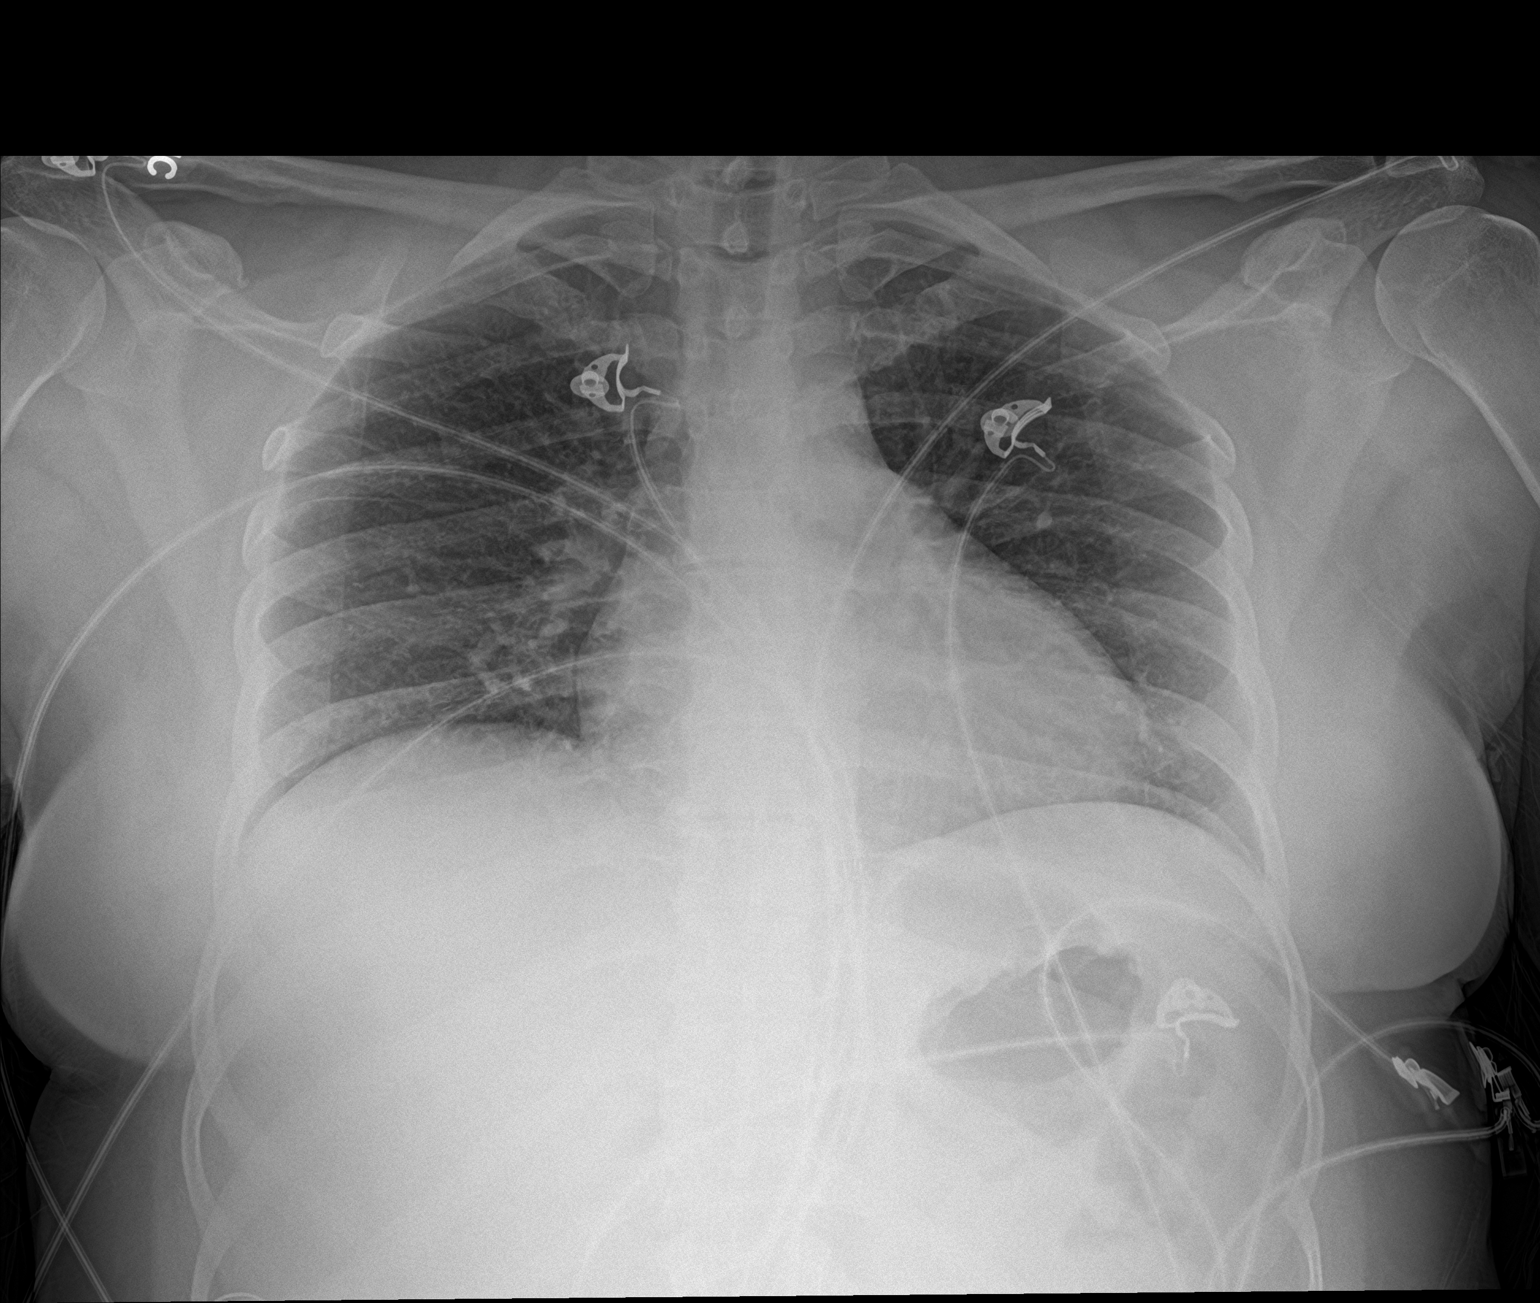

[1 of 1 positions shown; findings below may reference images not displayed]

FINDINGS: Low lung volumes. Heart and mediastinal contours are within normal
limits. No focal opacities or effusions. No acute bony abnormality.
IMPRESSION: Low volumes.  No active cardiopulmonary disease.

## 2016-10-21 MED ORDER — ENOXAPARIN SODIUM 80 MG/0.8ML ~~LOC~~ SOLN
80.0000 mg | Freq: Two times a day (BID) | SUBCUTANEOUS | Status: DC
  Administered 2016-10-21 – 2016-10-22 (×2): 80 mg via SUBCUTANEOUS
  Filled 2016-10-21 (×3): qty 0.8

## 2016-10-21 MED ORDER — LORAZEPAM 2 MG/ML IJ SOLN
1.0000 mg | Freq: Once | INTRAMUSCULAR | Status: AC
  Administered 2016-10-21: 1 mg via INTRAVENOUS

## 2016-10-21 MED ORDER — ONDANSETRON HCL 4 MG/2ML IJ SOLN: 4.0000 mg | Freq: Four times a day (QID) | INTRAMUSCULAR | Status: DC | PRN

## 2016-10-21 MED ORDER — ACETAMINOPHEN 325 MG PO TABS
650.0000 mg | ORAL_TABLET | ORAL | Status: DC | PRN
  Administered 2016-10-21: 650 mg via ORAL
  Filled 2016-10-21: qty 2

## 2016-10-21 MED ORDER — ASPIRIN 81 MG PO CHEW
324.0000 mg | CHEWABLE_TABLET | Freq: Once | ORAL | Status: AC
  Administered 2016-10-21: 324 mg via ORAL
  Filled 2016-10-21: qty 4

## 2016-10-21 MED ORDER — METOPROLOL TARTRATE 25 MG PO TABS
12.5000 mg | ORAL_TABLET | Freq: Two times a day (BID) | ORAL | Status: DC
  Administered 2016-10-21 – 2016-10-22 (×2): 12.5 mg via ORAL
  Filled 2016-10-21 (×2): qty 1

## 2016-10-21 MED ORDER — LORAZEPAM 2 MG/ML IJ SOLN: 0.5000 mg | Freq: Once | INTRAMUSCULAR | Status: DC | PRN

## 2016-10-21 MED ORDER — LORAZEPAM 2 MG/ML IJ SOLN: 0.5000 mg | Freq: Once | INTRAMUSCULAR | Status: DC

## 2016-10-21 MED ORDER — MORPHINE SULFATE (PF) 2 MG/ML IV SOLN
1.0000 mg | Freq: Once | INTRAVENOUS | Status: AC
  Administered 2016-10-21: 1 mg via INTRAVENOUS
  Filled 2016-10-21: qty 1

## 2016-10-21 MED ORDER — MORPHINE SULFATE (PF) 2 MG/ML IV SOLN: 2.0000 mg | INTRAVENOUS | Status: DC | PRN

## 2016-10-21 NOTE — ED Notes (Signed)
Ambulated pt to toilet and back with assistance

## 2016-10-21 NOTE — ED Provider Notes (Signed)
[facility] Emergency Department Provider Note    First MD Initiated Contact with Patient 10/21/16 1021     (approximate)  I have reviewed the triage vital signs and the nursing notes.   HISTORY  Chief Complaint Near Syncope  Level V Caveat:  Patient non communicative  HPI Ashlee Rios is a 41 y.o. female no known past medical history presents with chief complaint of possible syncopal episode as well as chest pain and headache that occurred roughly 30 minutes prior to arrival. Patient noncommunicative. She is Spanish-speaking but will only nod yes or no to questions. States that she is not pregnant. States that the chest pain started before the headache. States she is also having shortness of breath. States she's never had this symptom before. Remainder of history limited as the patient will not provide any answers and there is no family to assist.   Past Medical History:  Diagnosis Date  . Migraines    FMH: no bleeding disorders PSH: no recent surgeries There are no active problems to display for this patient.     Prior to Admission medications   Not on File    Allergies Patient has no known allergies.    Social History Social History  Substance Use Topics  . Smoking status: Never Smoker  . Smokeless tobacco: Not on file  . Alcohol use No    Review of Systems Patient denies headaches, rhinorrhea, blurry vision, numbness, shortness of breath, chest pain, edema, cough, abdominal pain, nausea, vomiting, diarrhea, dysuria, fevers, rashes or hallucinations unless otherwise stated above in HPI. ____________________________________________   PHYSICAL EXAM:  VITAL SIGNS: Vitals:   10/21/16 1300 10/21/16 1330  BP: 109/76 114/72  Pulse: 91 76  Resp: 19 (!) 21  Temp:      Constitutional: Alert and oriented.uncomfortable appearing, tearful.. Eyes: Conjunctivae are normal. PERRL. EOMI. Head: Atraumatic. Nose: No  congestion/rhinnorhea. Mouth/Throat: Mucous membranes are moist.  Oropharynx non-erythematous. Neck: No stridor. Painless ROM. No cervical spine tenderness to palpation Hematological/Lymphatic/Immunilogical: No cervical lymphadenopathy. Cardiovascular: Normal rate, regular rhythm. Grossly normal heart sounds.  Good peripheral circulation. Respiratory: Normal respiratory effort.  No retractions. Lungs CTAB. Gastrointestinal: Soft and nontender. No distention. No abdominal bruits. No CVA tenderness. Musculoskeletal: No lower extremity tenderness nor edema.  No joint effusions. Neurologic:  Normal speech and language. No gross focal neurologic deficits are appreciated.  MAE spontaneously, no facial droop Skin:  Skin is warm, dry and intact. No rash noted.  ____________________________________________   LABS (all labs ordered are listed, but only abnormal results are displayed)  Results for orders placed or performed during the hospital encounter of 10/21/16 (from the past 24 hour(s))  CBC with Differential/Platelet     Status: Abnormal   Collection Time: 10/21/16 10:14 AM  Result Value Ref Range   WBC 9.2 3.6 - 11.0 K/uL   RBC 4.68 3.80 - 5.20 MIL/uL   Hemoglobin 13.6 12.0 - 16.0 g/dL   HCT 16.1 09.6 - 04.5 %   MCV 83.7 80.0 - 100.0 fL   MCH 29.1 26.0 - 34.0 pg   MCHC 34.8 32.0 - 36.0 g/dL   RDW 40.9 81.1 - 91.4 %   Platelets 287 150 - 440 K/uL   Neutrophils Relative % 44 %   Neutro Abs 4.0 1.4 - 6.5 K/uL   Lymphocytes Relative 46 %   Lymphs Abs 4.3 (H) 1.0 - 3.6 K/uL   Monocytes Relative 7 %   Monocytes Absolute 0.7 0.2 - 0.9 K/uL  Eosinophils Relative 2 %   Eosinophils Absolute 0.2 0 - 0.7 K/uL   Basophils Relative 1 %   Basophils Absolute 0.1 0 - 0.1 K/uL  Comprehensive metabolic panel     Status: Abnormal   Collection Time: 10/21/16 10:14 AM  Result Value Ref Range   Sodium 137 135 - 145 mmol/L   Potassium 3.5 3.5 - 5.1 mmol/L   Chloride 106 101 - 111 mmol/L   CO2 22 22  - 32 mmol/L   Glucose, Bld 161 (H) 65 - 99 mg/dL   BUN 12 6 - 20 mg/dL   Creatinine, Ser 1.61 0.44 - 1.00 mg/dL   Calcium 9.0 8.9 - 09.6 mg/dL   Total Protein 8.6 (H) 6.5 - 8.1 g/dL   Albumin 4.5 3.5 - 5.0 g/dL   AST 38 15 - 41 U/L   ALT 32 14 - 54 U/L   Alkaline Phosphatase 52 38 - 126 U/L   Total Bilirubin 0.6 0.3 - 1.2 mg/dL   GFR calc non Af Amer >60 >60 mL/min   GFR calc Af Amer >60 >60 mL/min   Anion gap 9 5 - 15  Troponin I     Status: None   Collection Time: 10/21/16 10:14 AM  Result Value Ref Range   Troponin I <0.03 <0.03 ng/mL  hCG, quantitative, pregnancy     Status: None   Collection Time: 10/21/16 10:14 AM  Result Value Ref Range   hCG, Beta Chain, Quant, S <1 <5 mIU/mL  Fibrin derivatives D-Dimer ([facility] only)     Status: None   Collection Time: 10/21/16 10:48 AM  Result Value Ref Range   Fibrin derivatives D-dimer ([facility]) 230 0 - 499  Urinalysis, Complete w Microscopic     Status: Abnormal   Collection Time: 10/21/16 11:34 AM  Result Value Ref Range   Color, Urine STRAW (A) YELLOW   APPearance CLEAR (A) CLEAR   Specific Gravity, Urine 1.008 1.005 - 1.030   pH 6.0 5.0 - 8.0   Glucose, UA NEGATIVE NEGATIVE mg/dL   Hgb urine dipstick SMALL (A) NEGATIVE   Bilirubin Urine NEGATIVE NEGATIVE   Ketones, ur NEGATIVE NEGATIVE mg/dL   Protein, ur NEGATIVE NEGATIVE mg/dL   Nitrite NEGATIVE NEGATIVE   Leukocytes, UA NEGATIVE NEGATIVE   RBC / HPF 0-5 0 - 5 RBC/hpf   WBC, UA 0-5 0 - 5 WBC/hpf   Bacteria, UA MANY (A) NONE SEEN   Squamous Epithelial / LPF 0-5 (A) NONE SEEN   Mucous PRESENT    Hyaline Casts, UA PRESENT   Troponin I     Status: Abnormal   Collection Time: 10/21/16  1:17 PM  Result Value Ref Range   Troponin I 0.59 (HH) <0.03 ng/mL   ____________________________________________  EKG My review and personal interpretation at Time: 10:12   Indication: chest pain  Rate: 90  Rhythm: sinus Axis: normal Other: non specific st changes, no STEMI, normal  intervals ____________________________________________  RADIOLOGY  I personally reviewed all radiographic images ordered to evaluate for the above acute complaints and reviewed radiology reports and findings.  These findings were personally discussed with the patient.  Please see medical record for radiology report.  ____________________________________________   PROCEDURES  Procedure(s) performed:  Procedures    Critical Care performed: no ____________________________________________   INITIAL IMPRESSION / ASSESSMENT AND PLAN / ED COURSE  Pertinent labs & imaging results that were available during my care of the patient were reviewed by me and considered in my medical decision making (  see chart for details).  DDX: ACS, pericarditis, esophagitis, boerhaaves, pe, dissection, pna, bronchitis, costochondritis   Ashlee Rios is a 41 y.o. who presents to the ED with what appears to be panic attack. Patient tearful and all are answering yes and no questions with non-of her head. He will dynamically stable and in no acute distress. Playing her chest. EKG shows some nonspecific changes but initial troponin is negative. Patient stating that symptoms started when she was disciplined by her bolus for listening to loud music. Denies any fevers and clinically does not appear to be septic.  The patient will be placed on continuous pulse oximetry and telemetry for monitoring.  Laboratory evaluation will be sent to evaluate for the above complaints.     Clinical Course as of Oct 22 1447  Mon Oct 21, 2016  1107 Blood work is reassuring. Patient becoming more verbal after having received Ativan. We'll continue to monitor.  [PR]  1434 Repeat troponin is elevated to 0.59. Patient clinically states that she feels improved And has no chest pain at this time therefore will not initiate heparin. Patient was provided aspirin however I am concerned that primary event was cardiac in nature and  will require admission for further evaluation and management.  Have discussed with the patient and available family all diagnostics and treatments performed thus far and all questions were answered to the best of my ability. The patient demonstrates understanding and agreement with plan.   [PR]    Clinical Course User Index [PR] Willy EddyPatrick Ashlin Kreps, MD     ____________________________________________   FINAL CLINICAL IMPRESSION(S) / ED DIAGNOSES  Final diagnoses:  Chest pain, unspecified type  Elevated troponin I level      NEW MEDICATIONS STARTED DURING THIS VISIT:  New Prescriptions   No medications on file     Note:  This document was prepared using Dragon voice recognition software and may include unintentional dictation errors.    Willy EddyPatrick Pranit Owensby, MD 10/21/16 1501

## 2016-10-21 NOTE — H&P (Signed)
[facility] - [place] [facility]   PATIENT NAME: Ashlee Rios    MR#:  [id number]  DATE OF BIRTH:  May 16, 1976  DATE OF ADMISSION:  10/21/2016  PRIMARY CARE PHYSICIAN: No PCP Per Patient   REQUESTING/REFERRING PHYSICIAN: Dr. Roxan Hockeyobinson  CHIEF COMPLAINT:  Chest pressure and tightness today  HISTORY OF PRESENT ILLNESS:  Ashlee Rios  is a 41 y.o. 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 Patient is Hispanic history is obtained via [place] interpreter. Patient is tearful still continues to have chest pressure and appears very anxious. She received Ativan earlier in the emergency room felt better. Her first set of cardiac enzyme was -0.03. Second was 0.059. Internal medicine was consulted for further nausea management She appears to be in sinus rhythm with intermittent sinus tach blood pressure stable.   PAST MEDICAL HISTORY:   Past Medical History:  Diagnosis Date  . Migraines     PAST SURGICAL HISTOIRY:  No past surgical history on file.  SOCIAL HISTORY:   Social History  Substance Use Topics  . Smoking status: Never Smoker  . Smokeless tobacco: Not on file  . Alcohol use No    FAMILY HISTORY:  No family history on file.  DRUG ALLERGIES:  No Known Allergies  REVIEW OF SYSTEMS:  Review of Systems  Constitutional: Negative for chills, fever and weight loss.  HENT: Negative for ear discharge, ear pain and nosebleeds.   Eyes: Negative for blurred vision, pain and discharge.  Respiratory: Positive for shortness of breath. Negative for sputum production, wheezing and stridor.   Cardiovascular: Positive for chest pain and palpitations. Negative for orthopnea and PND.  Gastrointestinal: Negative for abdominal pain, diarrhea, nausea and vomiting.  Genitourinary: Negative for frequency and urgency.  Musculoskeletal: Negative  for back pain and joint pain.  Neurological: Positive for weakness. Negative for sensory change, speech change and focal weakness.  Psychiatric/Behavioral: Negative for depression and hallucinations. The patient is nervous/anxious.      MEDICATIONS AT HOME:   Prior to Admission medications   Not on File      VITAL SIGNS:  Blood pressure 110/76, pulse 77, temperature 99.1 F (37.3 C), temperature source Oral, resp. rate 19, height 5\' 4"  (1.626 m), weight 86.2 kg (190 lb), last menstrual period 09/19/2016, SpO2 99 %.  PHYSICAL EXAMINATION:  GENERAL:  41 y.o.-year-old patient lying in the bed with no acute distress. Obese EYES: Pupils equal, round, reactive to light and accommodation. No scleral icterus. Extraocular muscles intact.  HEENT: Head atraumatic, normocephalic. Oropharynx and nasopharynx clear.  NECK:  Supple, no jugular venous distention. No thyroid enlargement, no tenderness.  LUNGS: Normal breath sounds bilaterally, no wheezing, rales,rhonchi or crepitation. No use of accessory muscles of respiration.  CARDIOVASCULAR: S1, S2 normal. No murmurs, rubs, or gallops.  ABDOMEN: Soft, nontender, nondistended. Bowel sounds present. No organomegaly or mass.  EXTREMITIES: No pedal edema, cyanosis, or clubbing.  NEUROLOGIC: Cranial nerves II through XII are intact. Muscle strength 5/5 in all extremities. Sensation intact. Gait not checked.  PSYCHIATRIC: The patient is alert and oriented x 3. Anxious SKIN: No obvious rash, lesion, or ulcer.   LABORATORY PANEL:   CBC  Recent Labs Lab 10/21/16 1014  WBC 9.2  HGB 13.6  HCT 39.2  PLT 287   ------------------------------------------------------------------------------------------------------------------  Chemistries   Recent Labs Lab 10/21/16 1014  NA 137  K 3.5  CL 106  CO2 22  GLUCOSE 161*  BUN 12  CREATININE 0.64  CALCIUM 9.0  AST 38  ALT 32  ALKPHOS 52  BILITOT 0.6    ------------------------------------------------------------------------------------------------------------------  Cardiac Enzymes  Recent Labs Lab 10/21/16 1317  TROPONINI 0.59*   ------------------------------------------------------------------------------------------------------------------  RADIOLOGY:  Ct Head Wo Contrast  Result Date: 10/21/2016 CLINICAL DATA:  Syncopal episode with headaches EXAM: CT HEAD WITHOUT CONTRAST TECHNIQUE: Contiguous axial images were obtained from the base of the skull through the vertex without intravenous contrast. COMPARISON:  None. FINDINGS: Brain: No evidence of acute infarction, hemorrhage, hydrocephalus, extra-axial collection or mass lesion/mass effect. Vascular: No hyperdense vessel or unexpected calcification. Skull: Normal. Negative for fracture or focal lesion. Sinuses/Orbits: No acute finding. Other: None. IMPRESSION: No acute abnormality noted. Electronically Signed   By: Alcide Clever M.D.   On: 10/21/2016 10:49   Dg Chest Portable 1 View  Result Date: 10/21/2016 CLINICAL DATA:  Acute chest pain. EXAM: PORTABLE CHEST 1 VIEW COMPARISON:  10/11/2014 FINDINGS: Low lung volumes. Heart and mediastinal contours are within normal limits. No focal opacities or effusions. No acute bony abnormality. IMPRESSION: Low volumes.  No active cardiopulmonary disease. Electronically Signed   By: Charlett Nose M.D.   On: 10/21/2016 10:53    EKG:   Normal sinus rhythm. IMPRESSION AND PLAN:    Ashlee Rios  is a 41 y.o. female with a known history of Migraine headaches comes to the emergency room after she had had a stressful morning at work with her manager  1. Chest pain with positive troponin -Admitted to telemetry -Cardiac enzymes 3 -Lovenox 1 mg/kg twice a day. Dr. Juliann Pares aware of consultation  -When necessary morphine -81 mg aspirin, metoprolol 12.5 mg twice a day  2. Anxiety -Patient had a stressful event at work with her  manager/Boss -We'll give when necessary Ativan if needed  3. DVT prophylaxis subcutaneous Lovenox  All the records are reviewed and case discussed with ED provider. Management plans discussed with the patient, family and they are in agreement.  CODE STATUS: Full  TOTAL TIME TAKING CARE OF THIS PATIENT: 50 minutes.    Ashlee Rios M.D on 10/21/2016 at 4:01 PM  Between 7am to 6pm - Pager - [phone]  After 6pm go to [email or web address] - password [email or web address] [facility]  [facility]  Office  [phone]  CC: Primary care physician; No PCP Per Patient

## 2016-10-21 NOTE — ED Triage Notes (Addendum)
Pt arrived via EMS from work for a possible syncopal episode, pt is crying upon arrival, interpreter called, pt complaining of chest pain, pt will not speak with interpreter

## 2016-10-21 NOTE — Progress Notes (Signed)
ANTICOAGULATION CONSULT NOTE - Initial Consult  Pharmacy Consult for lovenox dosing Indication: chest pain/ACS  No Known Allergies  Patient Measurements: Height: 5\' 4"  (162.6 cm) Weight: 190 lb (86.2 kg) IBW/kg (Calculated) : 54.7 Heparin Dosing Weight: 73.7 kg  Vital Signs: Temp: 99.1 F (37.3 C) (02/26 1017) Temp Source: Oral (02/26 1017) BP: 145/90 (02/26 1630) Pulse Rate: 98 (02/26 1630)  Labs:  Recent Labs  10/21/16 1014 10/21/16 1317  HGB 13.6  --   HCT 39.2  --   PLT 287  --   CREATININE 0.64  --   TROPONINI <0.03 0.59*    Estimated Creatinine Clearance: 101.3 mL/min (by C-G formula based on SCr of 0.64 mg/dL).   Medical History: Past Medical History:  Diagnosis Date  . Migraines     Medications:  Scheduled:  . enoxaparin (LOVENOX) injection  80 mg Subcutaneous Q12H  . metoprolol tartrate  12.5 mg Oral BID    Assessment: Patient admitted for CP and tightness found to have trop of 0.59. Pharmacy is being consulted for lovenox dosing.  Goal of Therapy:  Resolution of signs/symptoms of chest pain/tightness or until patient goes to cath Monitor platelets by anticoagulation protocol: Yes   Plan:  Lovenox 80 mg q12h (1 mg/kg) for CP/ACS. Will monitor H/H plts.  Thank you for this consult.  Thomasene Rippleavid Ahriana Gunkel, PharmD, BCPS Clinical Pharmacist 10/21/2016

## 2016-10-22 LAB — CK: Total CK: 85 U/L (ref 38–234)

## 2016-10-22 LAB — TROPONIN I: TROPONIN I: 0.31 ng/mL — AB (ref <0.03)

## 2016-10-22 MED ORDER — METOPROLOL SUCCINATE ER 25 MG PO TB24: 12.5000 mg | ORAL_TABLET | Freq: Every day | ORAL | 0 refills | Status: AC

## 2016-10-22 MED ORDER — ASPIRIN EC 81 MG PO TBEC: 81.0000 mg | DELAYED_RELEASE_TABLET | Freq: Every day | ORAL | Status: AC

## 2016-10-22 NOTE — Plan of Care (Signed)
Problem: Safety: Goal: Ability to remain free from injury will improve Outcome: Progressing Pt remains free from falls/injury this shift. Non skid socks in place, bed alarm activated, fall contract signed. Pt educated to call if help needed  Problem: Pain Managment: Goal: General experience of comfort will improve Outcome: Progressing Pt with no complaints of pain this shift

## 2016-10-22 NOTE — Progress Notes (Signed)
Subjective:  Patient states to be doing better today denies any further chest pain today since being admitted headache is improved as well. Patient has not ambulated yet but is about to do so and is feeling well enough to maybe go home. Patient is here with husband  Objective:  Vital Signs in the last 24 hours: Temp:  [97.7 F (36.5 C)-98.8 F (37.1 C)] 98.5 F (36.9 C) (02/27 1203) Pulse Rate:  [69-98] 70 (02/27 1203) Resp:  [16-33] 20 (02/27 1203) BP: (96-145)/(60-90) 107/72 (02/27 1203) SpO2:  [95 %-99 %] 98 % (02/27 1203)  Intake/Output from previous day: 02/26 0701 - 02/27 0700 In: -  Out: 2400 [Urine:2400] Intake/Output from this shift: Total I/O In: -  Out: 600 [Urine:600]  Physical Exam: General appearance: appears stated age Neck: no adenopathy, no carotid bruit, no JVD, supple, symmetrical, trachea midline and thyroid not enlarged, symmetric, no tenderness/mass/nodules Lungs: clear to auscultation bilaterally Heart: regular rate and rhythm, S1, S2 normal, no murmur, click, rub or gallop Abdomen: soft, non-tender; bowel sounds normal; no masses,  no organomegaly Extremities: extremities normal, atraumatic, no cyanosis or edema Pulses: 2+ and symmetric Skin: Skin color, texture, turgor normal. No rashes or lesions Neurologic: Alert and oriented X 3, normal strength and tone. Normal symmetric reflexes. Normal coordination and gait  Lab Results:  Recent Labs  10/21/16 1014  WBC 9.2  HGB 13.6  PLT 287    Recent Labs  10/21/16 1014  NA 137  K 3.5  CL 106  CO2 22  GLUCOSE 161*  BUN 12  CREATININE 0.64    Recent Labs  10/21/16 2234 10/22/16 0138  TROPONINI 0.37* 0.31*   Hepatic Function Panel  Recent Labs  10/21/16 1014  PROT 8.6*  ALBUMIN 4.5  AST 38  ALT 32  ALKPHOS 52  BILITOT 0.6   No results for input(s): CHOL in the last 72 hours. No results for input(s): PROTIME in the last 72 hours.  Imaging: Imaging results have been  reviewed  Cardiac Studies:  Assessment/Plan:  Angina  Chest pain Headache Mild obesity Elevated blood glucose Borderline troponins Borderline EKG . Plan agree with overnight evaluation Troponins continue to be flat with borderline elevation EKG essentially unchanged No arrhythmias on telemetry Recommend increase activity ambulate in halls Recommend aspirin 81 mg a day Consider outpatient functional study 1-2 weeks Provide work note for husband has been here with her suggested   LOS: 0 days    Ashlee Rios D Ashlee Rios 10/22/2016, 12:29 PM

## 2016-10-22 NOTE — Progress Notes (Signed)
Pt. Requested an Advance Directives. Chaplain provided an AD in [place]. Pt. Spoke a little bit of English but she could communicate with the Chaplain without any problem. Pt. Would notify her nurse when she will be ready for making the AD she received official.

## 2016-10-22 NOTE — Progress Notes (Signed)
To Whom It May Concern :   Mr. Ashlee Rios has been with his wife since 2/26 and 2/27 while she's been in the hospital. She is therefore being discharged today and he will be cleared to leave the hospital with her around 1 PM today 2/27. He was personally here with her from her ER evaluation and admission until her discharge today.    This letter should function as a work note for him next operation that he was in fact here with his wife during her hospitalization and her illness.                                                       Sincerely   Alwyn Peawayne D Bertha Earwood M.D. 10/22/2016
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
## 2016-10-22 NOTE — Progress Notes (Signed)
Discharge paperwork reviewed with patient and husband. Information regarding follow-up appointments, medications and education for all newly prescribed meds was provided, all questions answered. Peripheral IV removed, catheter intact. Heart monitor removed, returned to the nurse station. Transport requested via wheelchair to the lobby for discharge.

## 2016-10-22 NOTE — Progress Notes (Signed)
[facility]         [place]   10/22/2016  Patient: Ashlee Rios   Date of Birth:  09/04/1975  Date of admission:  10/21/2016  Date of Discharge  10/22/2016    To Whom it May Concern:   Ashlee Rios  may return to work on 10/02/2016.  If you have any questions or concerns, please don't hesitate to call.  Sincerely,   Milagros LollSudini, Vernal Hritz R M.D Office : [phone]   .

## 2016-10-30 NOTE — Discharge Summary (Signed)
[facility] [place] [facility]   PATIENT NAME: Ashlee Rios    MR#:  [id number]  DATE OF BIRTH:  01-31-1976  DATE OF ADMISSION:  10/21/2016 ADMITTING PHYSICIAN: Enedina Finner, MD  DATE OF DISCHARGE: 10/22/2016  2:21 PM  PRIMARY CARE PHYSICIAN: No PCP Per Patient   ADMISSION DIAGNOSIS:  Elevated troponin I level [R74.8] Chest pain, unspecified type [R07.9]  DISCHARGE DIAGNOSIS:  Active Problems:   Chest pain   SECONDARY DIAGNOSIS:   Past Medical History:  Diagnosis Date  . Migraines      ADMITTING HISTORY  HISTORY OF PRESENT ILLNESS:  Ashlee Rios  is a 41 y.o. 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 Patient is Hispanic history is obtained via [place] interpreter. Patient is tearful still continues to have chest pressure and appears very anxious. She received Ativan earlier in the emergency room felt better. Her first set of cardiac enzyme was -0.03. Second was 0.059. Internal medicine was consulted for further nausea management She appears to be in sinus rhythm with intermittent sinus tach blood pressure stable.   HOSPITAL COURSE:   * chest pain Patient had mild elevation troponin which is stable and slowly trended down. EKG showed no acute changes. No arrhythmias on telemetry. Patient was seen by cardiology Dr. Juliann Pares who suggested outpatient functional study in his office in one week. Appointment scheduled. By the day of discharge patient did not have any chest pain. She is being started on aspirin and small dose Toprol. Patient has ablated in the hallway without any problems.  Discussed with Dr. Juliann Pares on day of discharge.  Stable for discharge home.  CONSULTS OBTAINED:  Treatment Team:  Alwyn Pea, MD  DRUG ALLERGIES:  No Known Allergies  DISCHARGE MEDICATIONS:   Discharge Medication List as of 10/22/2016   1:40 PM    START taking these medications   Details  aspirin EC 81 MG tablet Take 1 tablet (81 mg total) by mouth daily., Starting Tue 10/22/2016, OTC    metoprolol succinate (TOPROL XL) 25 MG 24 hr tablet Take 0.5 tablets (12.5 mg total) by mouth daily., Starting Tue 10/22/2016, Print        Today   VITAL SIGNS:  Blood pressure 114/71, pulse 70, temperature 98.5 F (36.9 C), temperature source Oral, resp. rate 16, height 5\' 4"  (1.626 m), weight 86.2 kg (190 lb), last menstrual period 09/19/2016, SpO2 98 %.  I/O:  No intake or output data in the 24 hours ending 10/30/16 1632  PHYSICAL EXAMINATION:  Physical Exam  GENERAL:  41 y.o.-year-old patient lying in the bed with no acute distress.  LUNGS: Normal breath sounds bilaterally, no wheezing, rales,rhonchi or crepitation. No use of accessory muscles of respiration.  CARDIOVASCULAR: S1, S2 normal. No murmurs, rubs, or gallops.  ABDOMEN: Soft, non-tender, non-distended. Bowel sounds present. No organomegaly or mass.  NEUROLOGIC: Moves all 4 extremities. PSYCHIATRIC: The patient is alert and oriented x 3.  SKIN: No obvious rash, lesion, or ulcer.   DATA REVIEW:   CBC No results for input(s): WBC, HGB, HCT, PLT in the last 168 hours.  Chemistries  No results for input(s): NA, K, CL, CO2, GLUCOSE, BUN, CREATININE, CALCIUM, MG, AST, ALT, ALKPHOS, BILITOT in the last 168 hours.  Invalid input(s): GFRCGP  Cardiac Enzymes No results for input(s): TROPONINI in the last 168 hours.  Microbiology Results  Results for orders  placed or performed in visit on 05/28/12  Urine culture     Status: None   Collection Time: 05/28/12  5:39 PM  Result Value Ref Range Status   Micro Text Report   Final       SOURCE: CLEAN CATCH    COMMENT                   MIXED BACTERIAL ORGANISMS   COMMENT                   RESULTS SUGGESTIVE OF CONTAMINATION   ANTIBIOTIC                                                        RADIOLOGY:  No  results found.  Follow up with PCP in 1 week.  Management plans discussed with the patient, family and they are in agreement.  CODE STATUS:  Code Status History    Date Active Date Inactive Code Status Order ID Comments User Context   10/21/2016  4:44 PM 10/22/2016  5:21 PM Full Code [id number]  Enedina FinnerSona Patel, MD ED      TOTAL TIME TAKING CARE OF THIS PATIENT ON DAY OF DISCHARGE: more than 30 minutes.   Milagros LollSudini, Indyah Saulnier R M.D on 10/30/2016 at 4:32 PM  Between 7am to 6pm - Pager - [phone]  After 6pm go to [email or web address] - password [email or web address] [facility]  [facility] [place] [facility]  Office  [phone]  CC: Primary care physician; No PCP Per Patient  Note: This dictation was prepared with Dragon dictation along with smaller phrase technology. Any transcriptional errors that result from this process are unintentional.

## 2019-11-26 ENCOUNTER — Ambulatory Visit: Payer: Self-pay | Attending: Internal Medicine

## 2019-11-26 DIAGNOSIS — Z23 Encounter for immunization: Secondary | ICD-10-CM

## 2019-11-26 NOTE — Progress Notes (Signed)
   Covid-19 Vaccination Clinic  Name:  Ashlee Rios    MRN: [id number] DOB: 1976/02/09  11/26/2019  Ms. Ashlee Rios was observed post Covid-19 immunization for 15 minutes without incident. She was provided with Vaccine Information Sheet and instruction to access the V-Safe system.   Ms. Ashlee Rios was instructed to call 911 with any severe reactions post vaccine: Marland Kitchen Difficulty breathing  . Swelling of face and throat  . A fast heartbeat  . A bad rash all over body  . Dizziness and weakness   Immunizations Administered    Name Date Dose VIS Date Route   Pfizer COVID-19 Vaccine 11/26/2019  1:57 PM 0.3 mL 08/06/2019 Intramuscular   Manufacturer: [employer], [employer]   Lot: [id number]   NDC: [address]-0

## 2019-12-17 ENCOUNTER — Ambulatory Visit: Payer: Self-pay

## 2019-12-18 ENCOUNTER — Ambulatory Visit: Payer: Self-pay | Attending: Internal Medicine

## 2019-12-18 DIAGNOSIS — Z23 Encounter for immunization: Secondary | ICD-10-CM

## 2019-12-18 NOTE — Progress Notes (Signed)
   Covid-19 Vaccination Clinic  Name:  Ashlee Rios    MRN: [id number] DOB: [birth year]  12/18/2019  Ms. Ashlee Rios was observed post Covid-19 immunization for 15 minutes without incident. She was provided with Vaccine Information Sheet and instruction to access the V-Safe system.   Ms. Ashlee Rios was instructed to call 911 with any severe reactions post vaccine: Marland Kitchen Difficulty breathing  . Swelling of face and throat  . A fast heartbeat  . A bad rash all over body  . Dizziness and weakness   Immunizations Administered    Name Date Dose VIS Date Route   Pfizer COVID-19 Vaccine 12/18/2019 10:36 AM 0.3 mL 10/20/2018 Intramuscular   Manufacturer: [employer], [employer]   Lot: [id number]   NDC: [address]-1

## 2020-10-18 ENCOUNTER — Emergency Department: Payer: BLUE CROSS/BLUE SHIELD

## 2020-10-18 ENCOUNTER — Other Ambulatory Visit: Payer: Self-pay

## 2020-10-18 ENCOUNTER — Emergency Department
Admission: EM | Admit: 2020-10-18 | Discharge: 2020-10-18 | Disposition: A | Discharge status: Home / Self Care | Payer: BLUE CROSS/BLUE SHIELD | Attending: Emergency Medicine | Admitting: Emergency Medicine

## 2020-10-18 DIAGNOSIS — L732 Hidradenitis suppurativa: Secondary | ICD-10-CM | POA: Insufficient documentation

## 2020-10-18 DIAGNOSIS — E119 Type 2 diabetes mellitus without complications: Secondary | ICD-10-CM | POA: Insufficient documentation

## 2020-10-18 DIAGNOSIS — R222 Localized swelling, mass and lump, trunk: Secondary | ICD-10-CM | POA: Insufficient documentation

## 2020-10-18 DIAGNOSIS — R1032 Left lower quadrant pain: Secondary | ICD-10-CM | POA: Insufficient documentation

## 2020-10-18 DIAGNOSIS — Z7982 Long term (current) use of aspirin: Secondary | ICD-10-CM | POA: Diagnosis not present

## 2020-10-18 DIAGNOSIS — R109 Unspecified abdominal pain: Secondary | ICD-10-CM | POA: Diagnosis present

## 2020-10-18 HISTORY — DX: Type 2 diabetes mellitus without complications: E11.9

## 2020-10-18 LAB — COMPREHENSIVE METABOLIC PANEL
ALT: 33 U/L (ref 0–44)
AST: 39 U/L (ref 15–41)
Albumin: 4.1 g/dL (ref 3.5–5.0)
Alkaline Phosphatase: 68 U/L (ref 38–126)
Anion gap: 9 (ref 5–15)
BUN: 10 mg/dL (ref 6–20)
CO2: 24 mmol/L (ref 22–32)
Calcium: 9 mg/dL (ref 8.9–10.3)
Chloride: 102 mmol/L (ref 98–111)
Creatinine, Ser: 0.43 mg/dL — ABNORMAL LOW (ref 0.44–1.00)
GFR, Estimated: >60 mL/min (ref >60)
Glucose, Bld: 158 mg/dL — ABNORMAL HIGH (ref 70–99)
Potassium: 4 mmol/L (ref 3.5–5.1)
Sodium: 135 mmol/L (ref 135–145)
Total Bilirubin: 0.6 mg/dL (ref 0.3–1.2)
Total Protein: 8.2 g/dL — ABNORMAL HIGH (ref 6.5–8.1)

## 2020-10-18 LAB — POC URINE PREG, ED: Preg Test, Ur: NEGATIVE

## 2020-10-18 LAB — CBC
HCT: 38.2 % (ref 36.0–46.0)
Hemoglobin: 12.6 g/dL (ref 12.0–15.0)
MCH: 28.1 pg (ref 26.0–34.0)
MCHC: 33 g/dL (ref 30.0–36.0)
MCV: 85.3 fL (ref 80.0–100.0)
Platelets: 339 10*3/uL (ref 150–400)
RBC: 4.48 MIL/uL (ref 3.87–5.11)
RDW: 13.2 % (ref 11.5–15.5)
WBC: 13.2 10*3/uL — ABNORMAL HIGH (ref 4.0–10.5)
nRBC: 0 % (ref 0.0–0.2)

## 2020-10-18 LAB — URINALYSIS, COMPLETE (UACMP) WITH MICROSCOPIC
Bilirubin Urine: NEGATIVE
Glucose, UA: 50 mg/dL — AB
Ketones, ur: NEGATIVE mg/dL
Leukocytes,Ua: NEGATIVE
Nitrite: NEGATIVE
Protein, ur: NEGATIVE mg/dL
Specific Gravity, Urine: 1.008 (ref 1.005–1.030)
pH: 6 (ref 5.0–8.0)

## 2020-10-18 LAB — LIPASE, BLOOD: Lipase: 26 U/L (ref 11–51)

## 2020-10-18 IMAGING — CT CT ABD-PELV W/ CM
2 of 5 series · 16 of 46 positions shown, 18 images · IV contrast (APPLIED)
Comparison: CT abdomen pelvis dated 08/16/2013.

CLINICAL DATA: 44-year-old female with left lower quadrant
abdominal pain.

EXAM:
CT ABDOMEN AND PELVIS WITH CONTRAST
TECHNIQUE: Multidetector CT imaging of the abdomen and pelvis was performed
using the standard protocol following bolus administration of
intravenous contrast.
CONTRAST:  100mL OMNIPAQUE IOHEXOL 300 MG/ML  SOLN

[Series 2: routine abd/pel with · axial · 0.81mm/px · z∈[-702,-262]mm · 13 of 100 slices shown, 15 images]
[im 6/100  soft-tissue]
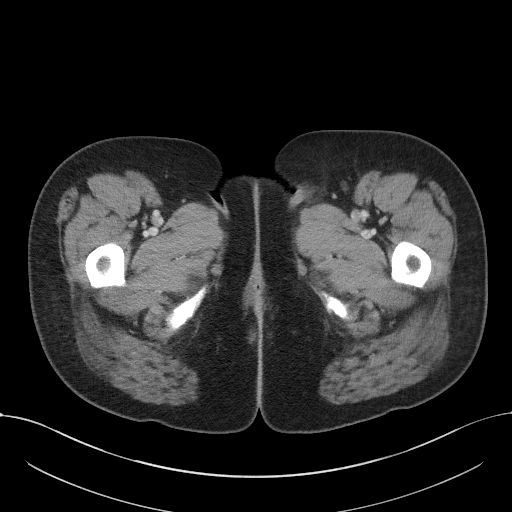
[im 6/100  bone]
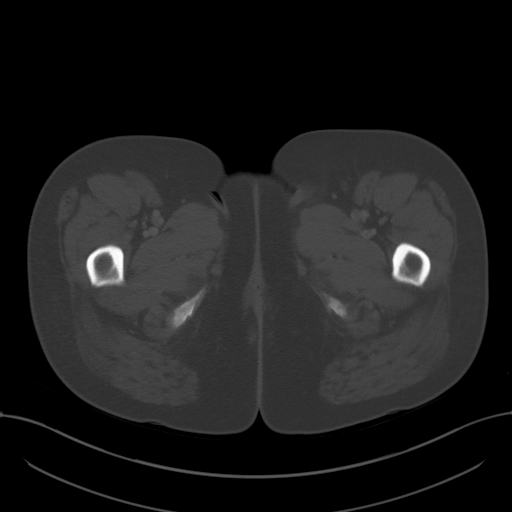
[im 16/100  soft-tissue]
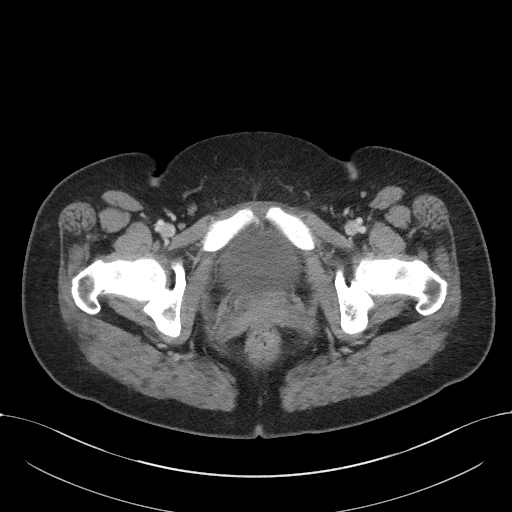
[im 21/100  soft-tissue]
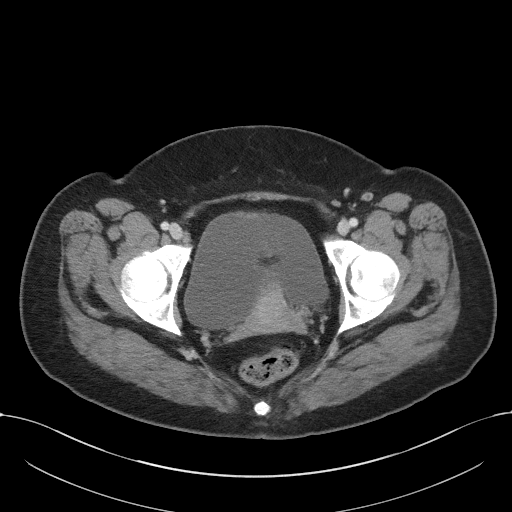
[im 27/100  soft-tissue]
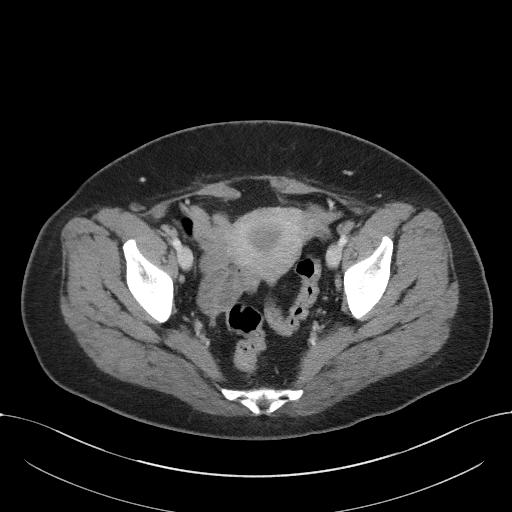
[im 37/100  soft-tissue]
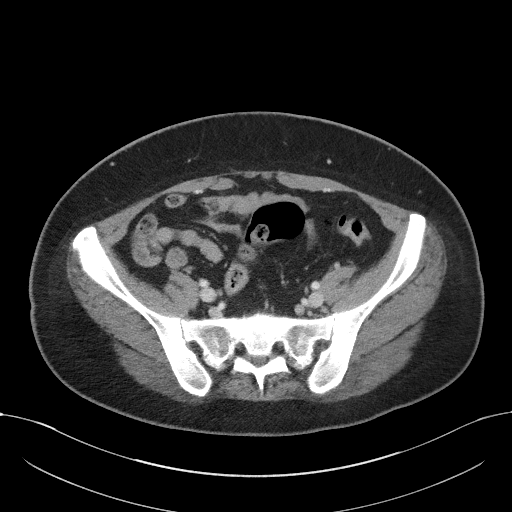
[im 42/100  soft-tissue]
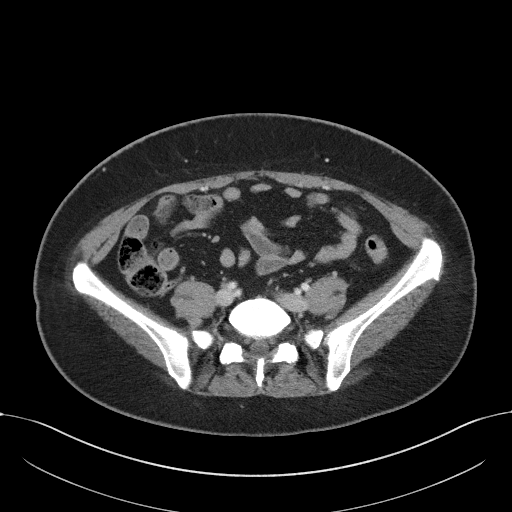
[im 53/100  soft-tissue]
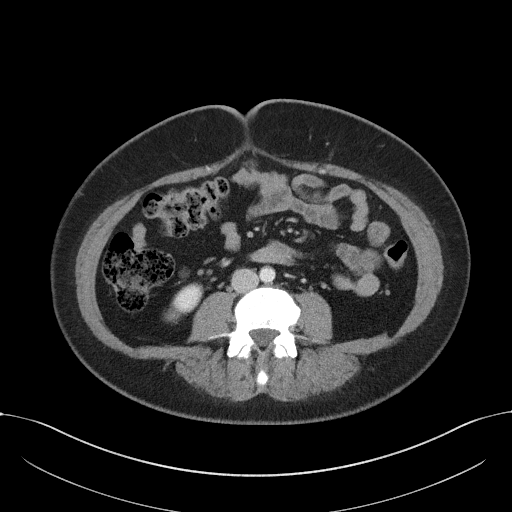
[im 58/100  soft-tissue]
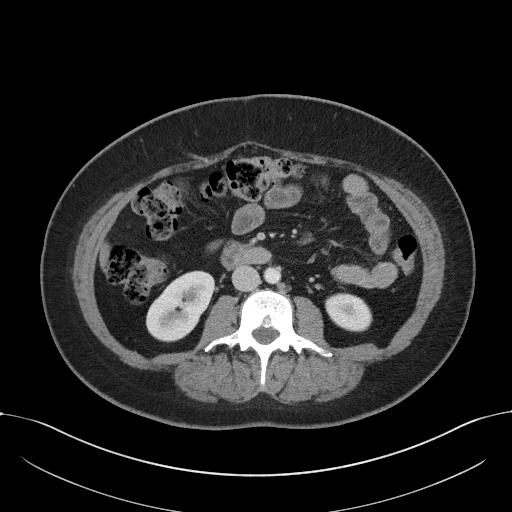
[im 63/100  soft-tissue]
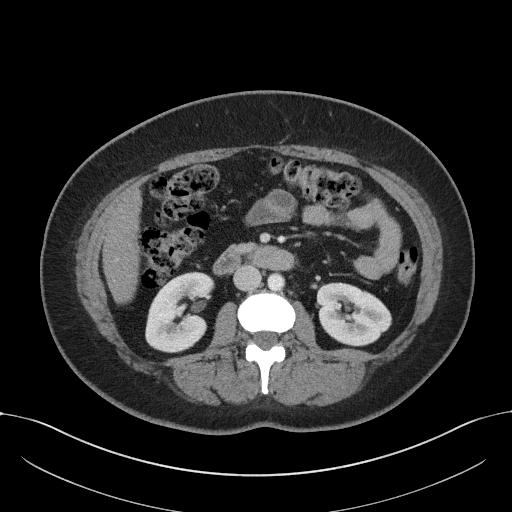
[im 63/100  bone]
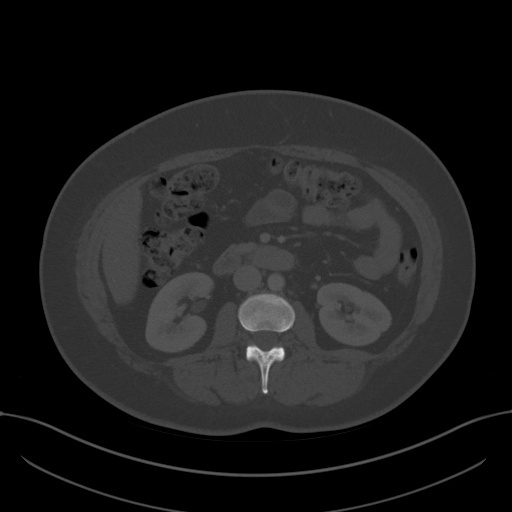
[im 73/100  soft-tissue]
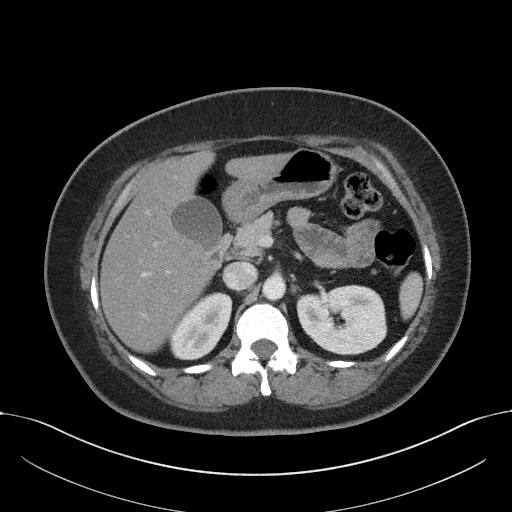
[im 79/100  soft-tissue]
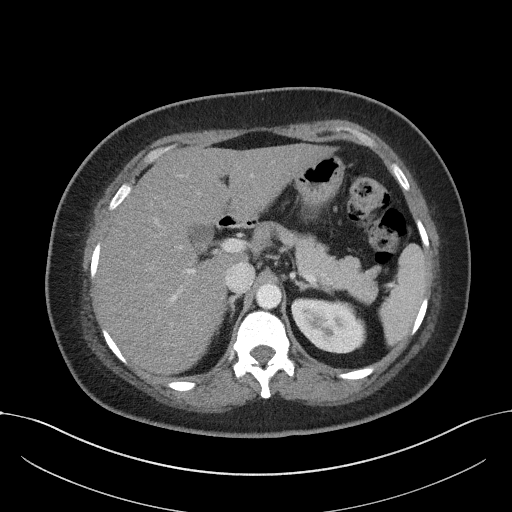
[im 84/100  soft-tissue]
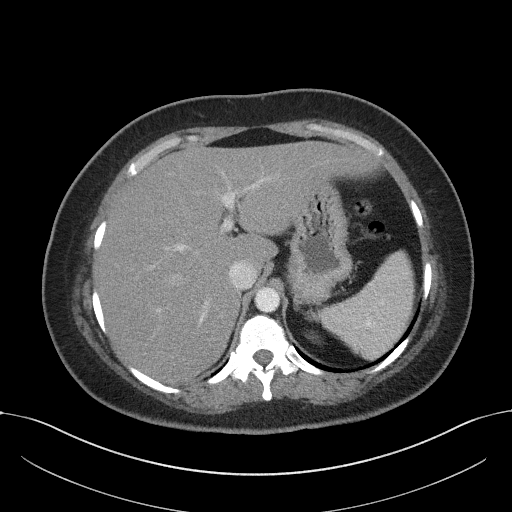
[im 94/100  soft-tissue]
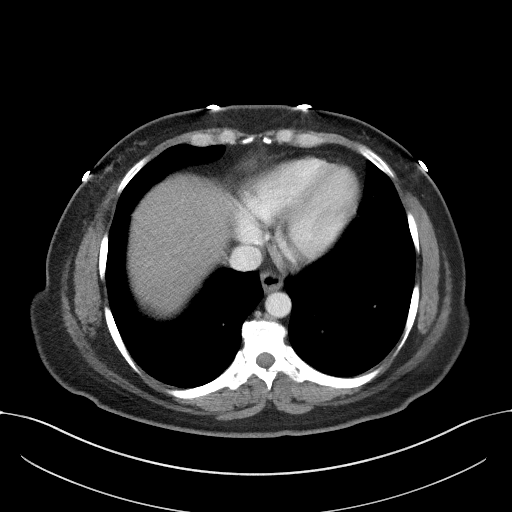

[Series 5: coronal st · coronal · 0.79mm/px · 3 of 88 slices shown]
[im 30/88  soft-tissue]
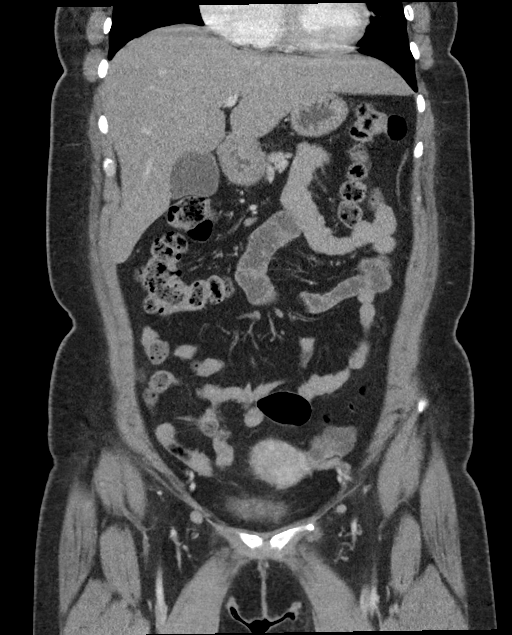
[im 39/88  soft-tissue]
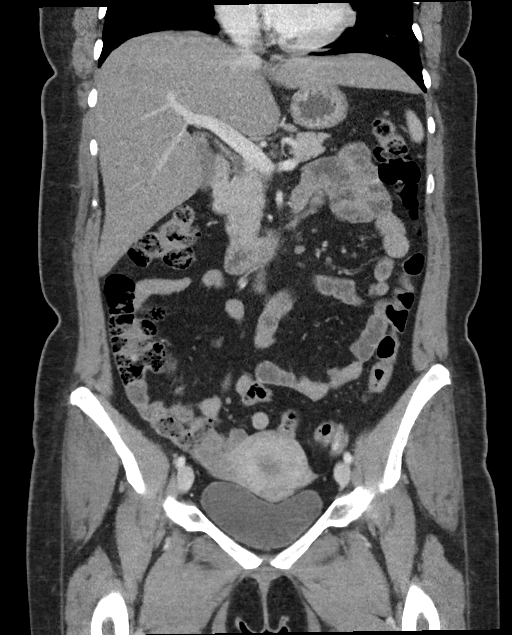
[im 49/88  soft-tissue]
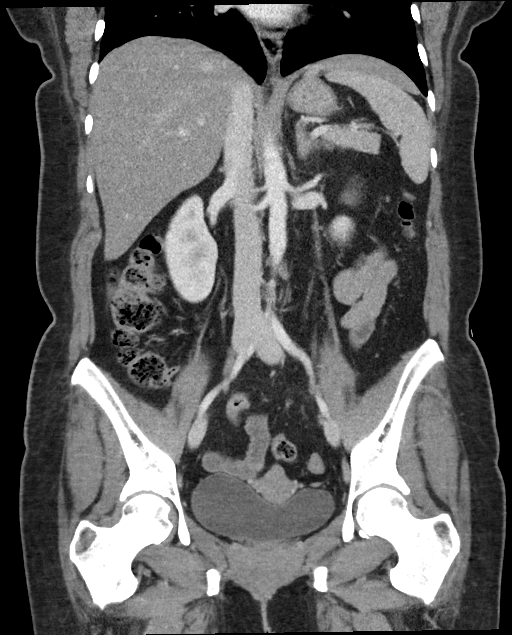

[16 of 46 positions shown; findings below may reference images not displayed]

FINDINGS: Lower chest: The visualized lung bases are clear.

No intra-abdominal free air or free fluid.

Hepatobiliary: Fatty liver. No intrahepatic dilatation. The
gallbladder is unremarkable.

Pancreas: Unremarkable. No pancreatic ductal dilatation or
surrounding inflammatory changes.

Spleen: Normal in size without focal abnormality.

Adrenals/Urinary Tract: The adrenal glands unremarkable. There is no
hydronephrosis on either side. There is symmetric enhancement and
excretion of contrast by both kidneys. The visualized ureters and
urinary bladder appear unremarkable.

Stomach/Bowel: There is no bowel obstruction or active inflammation.
The appendix is normal.

Vascular/Lymphatic: The abdominal aorta and IVC are unremarkable. No
portal venous gas. There is no adenopathy.

Reproductive: The uterus is anteverted and grossly unremarkable.
There is a 2 cm left ovarian corpus luteum as well as smaller
follicles in the left ovary. The right ovary is not well visualized.

Other: None

Musculoskeletal: No acute or significant osseous findings.
IMPRESSION: 1. No acute intra-abdominal or pelvic pathology. No bowel
obstruction. Normal appendix.
2. A 2 cm left ovarian corpus luteum.
3. Fatty liver.

## 2020-10-18 MED ORDER — CEPHALEXIN 500 MG PO CAPS: 500.0000 mg | ORAL_CAPSULE | Freq: Two times a day (BID) | ORAL | 0 refills | Status: AC

## 2020-10-18 MED ORDER — OXYCODONE-ACETAMINOPHEN 5-325 MG PO TABS
1.0000 | ORAL_TABLET | Freq: Once | ORAL | Status: AC
  Administered 2020-10-18: 1 via ORAL
  Filled 2020-10-18: qty 1

## 2020-10-18 MED ORDER — IOHEXOL 300 MG/ML  SOLN
100.0000 mL | Freq: Once | INTRAMUSCULAR | Status: AC | PRN
  Administered 2020-10-18: 100 mL via INTRAVENOUS

## 2020-10-18 NOTE — ED Notes (Signed)
Patient to and from CT.

## 2020-10-18 NOTE — ED Notes (Signed)
IV attempted x2 no success, another RN attempted with [place], no success.

## 2020-10-18 NOTE — Discharge Instructions (Signed)
Call your dermatologist to get a follow-up appointment for your boils.  Your CT scan was reassuring.  Take ibuprofen 600 every 6 hours with food and Tylenol 1 g every 8 hours to help with pain.  Take the antibiotics to help with infection  Dr. Carles Collet  Phone: [phone]

## 2020-10-18 NOTE — ED Provider Notes (Addendum)
[facility] Emergency Department Provider Note  ____________________________________________   Event Date/Time   First MD Initiated Contact with Patient 10/18/20 1445     (approximate)  I have reviewed the triage vital signs and the nursing notes.   HISTORY  Chief Complaint Abdominal Pain    HPI Ashlee Rios is a 45 y.o. female with diabetes who comes in for abdominal pain.  Patient reports abdominal pain for the past few days.  The pain was severe, constant, nothing makes it better, nothing makes it worse.  Mostly on the left side of her abdomen.  She reports that she has had boils on her mons area that have required drainage before.  She is followed by dermatology.  On review of records she has hidradenitis.  She states that she has some pain in these and it causes pain in her ear and her arm but she has had that before.  She states however she is never had this bout of abdominal pain with it.  No chest pain, no shortness of breath  Spanish interpreter was used for the history and updates          Past Medical History:  Diagnosis Date  . Diabetes mellitus without complication (HCC)   . Migraines     Patient Active Problem List   Diagnosis Date Noted  . Chest pain 10/21/2016    Past Surgical History:  Procedure Laterality Date  . CESAREAN SECTION      Prior to Admission medications   Medication Sig Start Date End Date Taking? Authorizing Provider  aspirin EC 81 MG tablet Take 1 tablet (81 mg total) by mouth daily. 10/22/16   Milagros Loll, MD  metoprolol succinate (TOPROL XL) 25 MG 24 hr tablet Take 0.5 tablets (12.5 mg total) by mouth daily. 10/22/16   Milagros Loll, MD    Allergies Patient has no known allergies.  No family history on file.  Social History Social History   Tobacco Use  . Smoking status: Never Smoker  . Smokeless tobacco: Never Used  Substance Use Topics  . Alcohol use: No  . Drug use: No       Review of Systems Constitutional: No fever/chills Eyes: No visual changes. ENT: No sore throat. Cardiovascular: Denies chest pain. Respiratory: Denies shortness of breath. Gastrointestinal: Abdominal pain no nausea, no vomiting.  No diarrhea.  No constipation. Genitourinary: Negative for dysuria. Musculoskeletal: Negative for back pain. Skin: Positive boils Neurological: Negative for headaches, focal weakness or numbness. All other ROS negative ____________________________________________   PHYSICAL EXAM:  VITAL SIGNS: ED Triage Vitals  Enc Vitals Group     BP 10/18/20 1238 (!) 150/81     Pulse Rate 10/18/20 1238 75     Resp 10/18/20 1238 16     Temp 10/18/20 1238 98.2 F (36.8 C)     Temp Source 10/18/20 1238 Oral     SpO2 10/18/20 1238 100 %     Weight 10/18/20 1247 192 lb (87.1 kg)     Height 10/18/20 1247 5\' 7"  (1.702 m)     Head Circumference --      Peak Flow --      Pain Score 10/18/20 1246 7     Pain Loc --      Pain Edu? --      Excl. in [place]? --     Constitutional: Alert and oriented. Well appearing and in no acute distress. Eyes: Conjunctivae are normal. EOMI. Head: Atraumatic. Nose: No congestion/rhinnorhea.  Mouth/Throat: Mucous membranes are moist.   Neck: No stridor. Trachea Midline. FROM Cardiovascular: Normal rate, regular rhythm. Grossly normal heart sounds.  Good peripheral circulation. Respiratory: Normal respiratory effort.  No retractions. Lungs CTAB. Gastrointestinal: Soft but tenderness in the left side of the abdomen. No distention. No abdominal bruits.  Musculoskeletal: No lower extremity tenderness nor edema.  No joint effusions. Neurologic:  Normal speech and language. No gross focal neurologic deficits are appreciated.  Skin:  Skin is warm, dry and intact. No rash noted. Psychiatric: Mood and affect are normal. Speech and behavior are normal. GU: 1 small lump on her left groin area with some smaller bumps on her mons.  Not feel  fluctuant.  Some tenderness associated with some  ____________________________________________   LABS (all labs ordered are listed, but only abnormal results are displayed)  Labs Reviewed  COMPREHENSIVE METABOLIC PANEL - Abnormal; Notable for the following components:      Result Value   Glucose, Bld 158 (*)    Creatinine, Ser 0.43 (*)    Total Protein 8.2 (*)    All other components within normal limits  CBC - Abnormal; Notable for the following components:   WBC 13.2 (*)    All other components within normal limits  URINALYSIS, COMPLETE (UACMP) WITH MICROSCOPIC - Abnormal; Notable for the following components:   Color, Urine STRAW (*)    APPearance HAZY (*)    Glucose, UA 50 (*)    Hgb urine dipstick SMALL (*)    Bacteria, UA MANY (*)    All other components within normal limits  URINE CULTURE  LIPASE, BLOOD  POC URINE PREG, ED   ____________________________________________  RADIOLOGY   Official radiology report(s): CT ABDOMEN PELVIS W CONTRAST  Result Date: 10/18/2020 CLINICAL DATA:  45 year old female with left lower quadrant abdominal pain. EXAM: CT ABDOMEN AND PELVIS WITH CONTRAST TECHNIQUE: Multidetector CT imaging of the abdomen and pelvis was performed using the standard protocol following bolus administration of intravenous contrast. CONTRAST:  OMNIPAQUE IOHEXOL 300 MG/ML  SOLN COMPARISON:  CT abdomen pelvis dated 08/16/2013. FINDINGS: Lower chest: The visualized lung bases are clear. No intra-abdominal free air or free fluid. Hepatobiliary: Fatty liver. No intrahepatic dilatation. The gallbladder is unremarkable. Pancreas: Unremarkable. No pancreatic ductal dilatation or surrounding inflammatory changes. Spleen: Normal in size without focal abnormality. Adrenals/Urinary Tract: The adrenal glands unremarkable. There is no hydronephrosis on either side. There is symmetric enhancement and excretion of contrast by both kidneys. The visualized ureters and urinary  bladder appear unremarkable. Stomach/Bowel: There is no bowel obstruction or active inflammation. The appendix is normal. Vascular/Lymphatic: The abdominal aorta and IVC are unremarkable. No portal venous gas. There is no adenopathy. Reproductive: The uterus is anteverted and grossly unremarkable. There is a 2 cm left ovarian corpus luteum as well as smaller follicles in the left ovary. The right ovary is not well visualized. Other: None Musculoskeletal: No acute or significant osseous findings. IMPRESSION: 1. No acute intra-abdominal or pelvic pathology. No bowel obstruction. Normal appendix. 2. A 2 cm left ovarian corpus luteum. 3. Fatty liver. Electronically Signed   By: Elgie Collard M.D.   On: 10/18/2020 16:53    ____________________________________________   PROCEDURES  Procedure(s) performed (including Critical Care):  Procedures   ____________________________________________   INITIAL IMPRESSION / ASSESSMENT AND PLAN / ED COURSE  Ashlee Rios was evaluated in Emergency Department on 10/18/2020 for the symptoms described in the history of present illness. She was evaluated in the context of the  global COVID-19 pandemic, which necessitated consideration that the patient might be at risk for infection with the SARS-CoV-2 virus that causes COVID-19. Institutional protocols and algorithms that pertain to the evaluation of patients at risk for COVID-19 are in a state of rapid change based on information released by regulatory bodies including the CDC and federal and state organizations. These policies and algorithms were followed during the patient's care in the ED.    Patient is a 45 year old who comes in with abdominal pain is her chief complaint.  Denies having this previously.  Patient is tender lower abdomen mostly on the left side.  Will get CT scan to evaluate for appendicitis, diverticulitis, ovarian issue.  Patient's pregnancy test was negative which rules out ectopic.   She denies any symptoms of vaginal discharge or concerns for STDs.  Lower suspicion for PID.  She does have hidradenitis and has some small boils in her groin and on her mons.  Do not feel fluctuant I do not think that I could drain but patient is followed by dermatology and has been unroofed frequently.  I discussed with patient that for this we will start her on some antibiotics and pain medication but she needs follow-up with dermatology for the unroofing.   Present test is negative Labs are reassuring White count slightly elevated at 13.2 UA does have a lot of bacteria in it CT scan was negative  Discussed with patient the reassuring CT scan.  Patient denies any sexual contacts and we discussed utility of pelvic exam to evaluate for STDs/PID but patient declined stating that she is very low risk and denies any vaginal symptoms or new partners.  We discussed continue Tylenol and ibuprofen for pain and will give some Keflex to help with the infections and patient will follow up with her dermatologist to see if they need to be unroofed again.  Patient expressed understanding and felt comfortable with this plan        ____________________________________________   FINAL CLINICAL IMPRESSION(S) / ED DIAGNOSES   Final diagnoses:  Hidradenitis  Left lower quadrant abdominal pain      MEDICATIONS GIVEN DURING THIS VISIT:  Medications  oxyCODONE-acetaminophen (PERCOCET/ROXICET) 5-325 MG per tablet 1 tablet (1 tablet Oral Given 10/18/20 1527)  iohexol (OMNIPAQUE) 300 MG/ML solution 100 mL (100 mLs Intravenous Contrast Given 10/18/20 1623)     ED Discharge Orders         Ordered    cephALEXin (KEFLEX) 500 MG capsule  2 times daily        10/18/20 1744           Note:  This document was prepared using Dragon voice recognition software and may include unintentional dictation errors.   Concha Se, MD 10/18/20 1448    Concha Se, MD 10/18/20 [phone]

## 2020-10-18 NOTE — ED Triage Notes (Signed)
Triage completed using [employer] language interpreter [phone]  Pt to ER from [facility] clinic with complaints of lower abdominal pain. Denies urinary symptoms, last BM this morning. Denies NVD. Pt also reports "pimples"/ "large lump" in groin area, that radiates down into left foot pain , headache, and left arm pain. Pt also reports ringing in her L ear. Pt reports history of the "pimples" before but denies pain that time, reports she had them removed.

## 2020-10-20 LAB — URINE CULTURE

## 2022-02-08 ENCOUNTER — Other Ambulatory Visit: Payer: Self-pay | Admitting: Family Medicine

## 2022-02-08 ENCOUNTER — Other Ambulatory Visit: Payer: Self-pay

## 2022-02-08 DIAGNOSIS — Z1231 Encounter for screening mammogram for malignant neoplasm of breast: Secondary | ICD-10-CM

## 2022-08-29 ENCOUNTER — Emergency Department: Payer: BLUE CROSS/BLUE SHIELD

## 2022-08-29 ENCOUNTER — Emergency Department
Admission: EM | Admit: 2022-08-29 | Discharge: 2022-08-29 | Disposition: A | Discharge status: Home / Self Care | Payer: BLUE CROSS/BLUE SHIELD | Attending: Emergency Medicine | Admitting: Emergency Medicine

## 2022-08-29 ENCOUNTER — Other Ambulatory Visit: Payer: Self-pay

## 2022-08-29 ENCOUNTER — Encounter: Payer: Self-pay | Admitting: Emergency Medicine

## 2022-08-29 DIAGNOSIS — R911 Solitary pulmonary nodule: Secondary | ICD-10-CM

## 2022-08-29 DIAGNOSIS — E119 Type 2 diabetes mellitus without complications: Secondary | ICD-10-CM | POA: Insufficient documentation

## 2022-08-29 DIAGNOSIS — R918 Other nonspecific abnormal finding of lung field: Secondary | ICD-10-CM | POA: Insufficient documentation

## 2022-08-29 DIAGNOSIS — I10 Essential (primary) hypertension: Secondary | ICD-10-CM | POA: Diagnosis not present

## 2022-08-29 DIAGNOSIS — U071 COVID-19: Secondary | ICD-10-CM | POA: Diagnosis not present

## 2022-08-29 DIAGNOSIS — G43C0 Periodic headache syndromes in child or adult, not intractable: Secondary | ICD-10-CM | POA: Insufficient documentation

## 2022-08-29 DIAGNOSIS — R059 Cough, unspecified: Secondary | ICD-10-CM | POA: Diagnosis present

## 2022-08-29 LAB — RESP PANEL BY RT-PCR (FLU A&B, COVID) ARPGX2
Influenza A by PCR: NEGATIVE
Influenza B by PCR: NEGATIVE
SARS Coronavirus 2 by RT PCR: POSITIVE — AB

## 2022-08-29 MED ORDER — SODIUM CHLORIDE 0.9 % IV BOLUS
1000.0000 mL | Freq: Once | INTRAVENOUS | Status: AC
  Administered 2022-08-29: 1000 mL via INTRAVENOUS

## 2022-08-29 MED ORDER — PROPRANOLOL HCL ER 60 MG PO CP24: 60.0000 mg | ORAL_CAPSULE | Freq: Every day | ORAL | 1 refills | Status: AC

## 2022-08-29 MED ORDER — DEXTROSE 5 % IV SOLN
20.0000 mg | Freq: Once | INTRAVENOUS | Status: AC
  Administered 2022-08-29: 20 mg via INTRAVENOUS
  Filled 2022-08-29: qty 4

## 2022-08-29 MED ORDER — KETOROLAC TROMETHAMINE 30 MG/ML IJ SOLN
30.0000 mg | Freq: Once | INTRAMUSCULAR | Status: AC
  Administered 2022-08-29: 30 mg via INTRAVENOUS
  Filled 2022-08-29: qty 1

## 2022-08-29 MED ORDER — BENZONATATE 100 MG PO CAPS
100.0000 mg | ORAL_CAPSULE | Freq: Four times a day (QID) | ORAL | 0 refills | Status: AC | PRN
Start: 2022-08-29 — End: ?

## 2022-08-29 NOTE — ED Notes (Signed)
Pt ambulated to bathroom with assistance and back to bed.

## 2022-08-29 NOTE — ED Provider Notes (Signed)
[facility] Provider Note    Event Date/Time   First MD Initiated Contact with Patient 08/29/22 1446     (approximate)   History   Headache  Spanish video interpreter utilized  HPI  Ashlee Rios is a 47 y.o. female reports a history of hypertension diabetes and migraines. She is compliant with her medications and is taking them this morning.  Since Saturday or Sunday she has been experiencing "migraine"  She has suffered from these since the age of 67 and they typically occur on the right side as a throbbing.  She started having throbbing right-sided headache that radiates her from the back of her scalp out and it has been steady but worsened a little bit when she coughs.  She has been coughing now for about a month.  She has seen her doctor at [facility] and they told her it was from "allergies"  She has not experienced any fevers or chills.  No neck pain.  No numbness tingling or weakness.  She reports that the headache is the same as her migraine she is experienced for years but this 1 is rather intense, also thinks that her brother's death which was about a week ago has caused a lot of stress and may be has contributed to this migraine  No visual changes.  Reports a moderate to severe right-sided throbbing headache.  No eye pain  Denies pregnancy     Physical Exam   Triage Vital Signs: ED Triage Vitals  Enc Vitals Group     BP 08/29/22 1342 (!) 157/76     Pulse Rate 08/29/22 1342 85     Resp 08/29/22 1342 16     Temp 08/29/22 1342 97.8 F (36.6 C)     Temp Source 08/29/22 1342 Oral     SpO2 08/29/22 1342 98 %     Weight 08/29/22 1341 192 lb 0.3 oz (87.1 kg)     Height 08/29/22 1341 5\' 7"  (1.702 m)     Head Circumference --      Peak Flow --      Pain Score 08/29/22 1341 9     Pain Loc --      Pain Edu? --      Excl. in [place]? --     Most recent vital signs: Vitals:   08/29/22 1342  BP: (!) 157/76  Pulse: 85  Resp:  16  Temp: 97.8 F (36.6 C)  SpO2: 98%     General: Awake, no distress.  Normocephalic atraumatic.  Occasional dry cough but no respiratory distress.  She is awake alert fully oriented and conversant.  She does not appear in distress. Mild photophobia is present.  Equal reactive pupils.  Normal extraocular movements.  Moves all extremities.  Normal speech clear language no motor deficits. CV:  Good peripheral perfusion.  Normal tones Resp:  Normal effort.  Clear bilaterally Abd:  No distention.  Other:  No meningismus.   ED Results / Procedures / Treatments   Labs (all labs ordered are listed, but only abnormal results are displayed) Labs Reviewed  RESP PANEL BY RT-PCR (FLU A&B, COVID) ARPGX2 - Abnormal; Notable for the following components:      Result Value   SARS Coronavirus 2 by RT PCR POSITIVE (*)    All other components within normal limits     RADIOLOGY no noted indication for imaging today.  Patient relates history of similar headaches since the age of 21.  Similar  in distribution and characteristic.  She has had previous brain imaging that was normal  CT head reviewed from 2018 which was normal  PROCEDURES:  Critical Care performed: No  Procedures   MEDICATIONS ORDERED IN ED: Medications  ketorolac (TORADOL) 30 MG/ML injection 30 mg (30 mg Intravenous Given 08/29/22 1536)  metoCLOPramide (REGLAN) 20 mg in dextrose 5 % 50 mL IVPB (0 mg Intravenous Stopped 08/29/22 1622)  sodium chloride 0.9 % bolus 1,000 mL (1,000 mLs Intravenous New Bag/Given 08/29/22 1537)     IMPRESSION / MDM / [place] / ED COURSE  I reviewed the triage vital signs and the nursing notes.                              Differential diagnosis includes, but is not limited to, migraine, occipital headache, recurrent or chronic headache syndrome etc.  She has no clinical signs or symptoms that would be suggestive of meningitis, encephalitis, stroke, central neurologic lesion MS etc.   Discussed with patient will trial medication for migraine and reassess.  I suspect increased stress and the loss of her brother have likely precipitated this headache episode which seems to be consistent with likely migraines given her clinical history.  Additionally she reports a cough for about 1 perhaps 2 months.  Seen by primary care.  No associated dyspnea.  The chronicity now for about 1 month or more I will obtain a chest x-ray she has not had 1 previous for this.  No cardiac symptoms.  No central neurologic symptoms.  No neurodeficits.  Patient's presentation is most consistent with acute complicated illness / injury requiring diagnostic workup.        ----------------------------------------- 3:56 PM on 08/29/2022 ----------------------------------------- Patient resting comfortably at this time.  Reports her headache has improved moderately, and is in no distress.  Also noted a possible nodule on her chest x-ray.  I have made referral to the nodule clinic for appropriate follow-up.  ----------------------------------------- 4:43 PM on 08/29/2022 ----------------------------------------- Patient feels improved resting comfortably without distress.  Patient's COVID-19 test is positive, I suspect her headache is multifactorial possibly related to stress, chronic migraines and now also suspected COVID-19 infection.  She has no acute concerning symptomatology such as hypoxia chest pain altered mental status etc.  Hemodynamics are stable.  She reports relief of her headache with medications.  Also noted she has been having a cough for about 2 months, and she takes lisinopril.  Suspicious this may be driving her cough.  Will discontinue this and she will switch to propranolol which she reports she has used successfully for migraine prevention in the past and may also help with hypertension.  She will be following up with [facility] and calling them to tomorrow to discuss her symptoms and  change in medication  Also she understands to follow-up with the pulmonary nodule clinic.  Careful return precautions advised.  She is not driving herself home.  Resting comfortably, symptoms improved.  Patient does not seem to have an obvious benefit to initiation of Paxlovid, reports symptoms started roughly Saturday.  Return precautions and treatment recommendations and follow-up discussed with the patient who is agreeable with the plan.   FINAL CLINICAL IMPRESSION(S) / ED DIAGNOSES   Final diagnoses:  Periodic headache syndrome, not intractable  Lung nodule  COVID-19     Rx / DC Orders   ED Discharge Orders          Ordered  AMB  Referral to Pulmonary Nodule Clinic       Comments: Possible nodule on CXR at ER.   08/29/22 1556    propranolol ER (INDERAL LA) 60 MG 24 hr capsule  Daily        08/29/22 1634    benzonatate (TESSALON PERLES) 100 MG capsule  Every 6 hours PRN        08/29/22 1635             Note:  This document was prepared using Dragon voice recognition software and may include unintentional dictation errors.   Sharyn Creamer, MD 08/29/22 [phone]

## 2022-08-29 NOTE — Discharge Instructions (Addendum)
Stop lisinopril. We suspect this is causing your cough. Start propranolol for headaches and blood pressure control.

## 2022-08-29 NOTE — ED Triage Notes (Signed)
C/O headache today.  C?O headache all over.  States headache started on Thursday.  Also c/o cough x 1 month.  AAOx3.  Skin warm and dry. NAD. Ambulates with easy and steady gait.  Posture upright and relaxed.

## 2022-10-10 ENCOUNTER — Institutional Professional Consult (permissible substitution): Payer: BLUE CROSS/BLUE SHIELD | Admitting: Student in an Organized Health Care Education/Training Program

## 2022-10-29 ENCOUNTER — Institutional Professional Consult (permissible substitution): Payer: BLUE CROSS/BLUE SHIELD | Admitting: Student in an Organized Health Care Education/Training Program

## 2022-12-03 ENCOUNTER — Institutional Professional Consult (permissible substitution): Payer: BLUE CROSS/BLUE SHIELD | Admitting: Student in an Organized Health Care Education/Training Program

## 2022-12-18 ENCOUNTER — Institutional Professional Consult (permissible substitution): Payer: BLUE CROSS/BLUE SHIELD | Admitting: Student in an Organized Health Care Education/Training Program

## 2022-12-19 ENCOUNTER — Encounter: Payer: Self-pay | Admitting: Family Medicine

## 2023-01-08 ENCOUNTER — Other Ambulatory Visit: Payer: Self-pay

## 2023-01-08 DIAGNOSIS — Z1231 Encounter for screening mammogram for malignant neoplasm of breast: Secondary | ICD-10-CM

## 2023-02-03 ENCOUNTER — Ambulatory Visit: Admission: RE | Admit: 2023-02-03 | Payer: Self-pay | Source: Ambulatory Visit

## 2023-02-03 ENCOUNTER — Ambulatory Visit: Payer: Self-pay | Attending: Hematology and Oncology

## 2024-03-01 ENCOUNTER — Encounter: Payer: Self-pay | Admitting: Family Medicine

## 2024-03-08 ENCOUNTER — Telehealth: Payer: Self-pay | Admitting: *Deleted

## 2024-09-08 ENCOUNTER — Telehealth: Payer: Self-pay
